# Patient Record
Sex: Female | Born: 1975 | Race: Black or African American | Hispanic: Yes | Marital: Single | Smoking: Never smoker
Health system: Southern US, Community
[De-identification: ages and names within clinical notes are randomized; demographics above are authoritative.]

## PROBLEM LIST (undated history)

## (undated) DIAGNOSIS — C801 Malignant (primary) neoplasm, unspecified: Secondary | ICD-10-CM

## (undated) DIAGNOSIS — K5900 Constipation, unspecified: Secondary | ICD-10-CM

## (undated) HISTORY — PX: ABDOMINOPLASTY: SUR9

## (undated) HISTORY — PX: DILATION AND CURETTAGE OF UTERUS: SHX78

---

## 2006-06-19 ENCOUNTER — Inpatient Hospital Stay (HOSPITAL_COMMUNITY): Admission: AD | Admit: 2006-06-19 | Discharge: 2006-06-21 | Payer: Self-pay | Admitting: Obstetrics and Gynecology

## 2006-08-15 ENCOUNTER — Other Ambulatory Visit: Admission: RE | Admit: 2006-08-15 | Discharge: 2006-08-15 | Payer: Self-pay | Admitting: Obstetrics and Gynecology

## 2006-08-29 ENCOUNTER — Encounter: Admission: RE | Admit: 2006-08-29 | Discharge: 2006-08-29 | Payer: Self-pay | Admitting: Occupational Medicine

## 2007-11-12 ENCOUNTER — Emergency Department (HOSPITAL_COMMUNITY): Admission: EM | Admit: 2007-11-12 | Discharge: 2007-11-12 | Payer: Self-pay | Admitting: Emergency Medicine

## 2010-12-16 ENCOUNTER — Encounter: Payer: Self-pay | Admitting: Obstetrics and Gynecology

## 2011-04-12 NOTE — H&P (Signed)
NAMECHRISTEAN, Sandra Macias NO.:  [id number]   MEDICAL RECORD NO.:  [id number]          PATIENT TYPE:  INP   LOCATION:                                [facility]   PHYSICIAN:  Huel Cote, M.D.      DATE OF BIRTH:   DATE OF ADMISSION:  06/19/2006  DATE OF DISCHARGE:  06/21/2006                                HISTORY & PHYSICAL   Re-schedule repeat C-section to be performed on 06/19/2006 at 7:30 a.m.   Patient is a 35 year old G5, P-1-0-3-1 who is coming in a approximately [redacted]  weeks gestation with an estimated due date of 06/28/2006 by her by her LMP  consistent with a first trimester ultrasound.  Patient has had a previous C-  section in the past and requested a repeat C-section initially; however, had  also been found to have a persistent breach presentation, and for both of  these reasons is undergoing a repeat C-section.  Her prenatal care otherwise  was uncomplicated, except for a positive group B strep status.  Prenatal  labs are as follows:  A-positive, antibody negative, sickle normal, RPR  nonreactive, Rubella immune, hepatitis B surface antigen negative, HIV  nonreactive, GC negative, Chlamydia negative.  Triple screen normal.  Group  B strep positive.   PAST OBSTETRICAL HISTORY:  Patient had a previous C-section in [place] in  the year 2000 of a 7-pound 8-ounce infant for a meconium and a non-  reassuring fetal heart re-tracing.  She has also had two spontaneous  miscarriages and one elective abortion.   PAST GYN HISTORY:  In 2001, the patient had an abnormal pap smear with an  office procedure performed, and this did normalize her pap smears.   PAST SURGICAL HISTORY:  The c-section only.   PAST MEDICAL HISTORY:  None significant.  Patient reports no known drug  allergies, is currently just taking her prenatal vitamins.   PHYSICAL EXAMINATION:  VITAL SIGNS:  Her weight is 199 pounds, blood  pressure 108/70.  CARDIAC:  Regular rate and  rhythm.  LUNGS:  Clear.  ABDOMEN:  Soft and nontender and gravid.  CERVIX:  Long and fingertip with a breach presentation noted just several  days prior to her admission for repeat c-section.  The patient was counseled  as to the risks and benefits of vaginal birth after a c-section prior to  finding of a breach presentation and had already decided that she would  proceed with a repeat c-section if no labor ensued by her 39th week.  Furthermore, she was found to have the breach presentation which persisted  through several weeks at the end of her pregnancy, and for this reason also  is a candidate for cesarean section.  She understands the risks and  benefits of c-section including bleeding and infection and possible damage  to bowel and bladder.  She understands that if she had a need for blood  transfusion this would be performed, although certainly not routine or  expected, and is okay with that as well.      Huel Cote, M.D.  Electronically Signed  KR/MEDQ  D:  06/18/2006  T:  06/18/2006  Job:  [id number]

## 2011-04-12 NOTE — Discharge Summary (Signed)
Sandra Macias, Sandra Macias          ACCOUNT NO.:  [id number]   MEDICAL RECORD NO.:  [id number]          PATIENT TYPE:  INP   LOCATION:  9124                          [facility]ILITY:  [facility]   PHYSICIAN:  Zenaida Niece, M.D.DATE OF BIRTH:  13-Sep-1976   DATE OF ADMISSION:  06/19/2006  DATE OF DISCHARGE:  06/21/2006                                 DISCHARGE SUMMARY   ADMISSION AND DISCHARGE DIAGNOSES:  Intrauterine pregnancy at 39 weeks,  previous cesarean section.   PROCEDURES:  She had a repeat low transverse cesarean section.   HISTORY AND PHYSICAL:  Please see chart for full History and Physical.  Briefly this is a 35 year old black female gravida 5, para 1-0-3-1 with an  EGA of [redacted] weeks who presents for repeat cesarean section. This is being done  for prior cesarean section and breech presentation. Prenatal care  essentially uncomplicated. Past history significant for 1 prior cesarean  section, 2 miscarriages, and 1 elective termination. The remainder of  history is noncontributory.   PHYSICAL EXAMINATION:  Significant for benign abdomen that is gravid with a  well-healed transverse scar. Cervix is fingertip and long, and there is a  breech presentation noted several days prior to admission.   HOSPITAL COURSE:  The patient was admitted and underwent repeat low  transverse cesarean section under spinal anesthesia. The baby was in the  vertex presentation at delivery. Estimated blood loss was 800 mL, and she  delivered a viable female infant with Apgars of 9 and 9, weight 7 pounds 2  ounces. The patient's anatomy was normal. Postoperatively she had no  significant complications. Predelivery hemoglobin 11.4; postoperative it is  10.5. On postoperative day #2 the patient requested discharge home, and she  was felt to be stable enough for this. Her incision was healing well.   DISCHARGE INSTRUCTIONS:  Regular diet, pelvic rest, no strenuous activity.  Follow-up is in 3-4 days for staple  removal.  Medications are Percocet #30 1-  2 p.o. q. 4-6 h. p.r.n. pain and over-the-counter ibuprofen as needed, and  she is given our discharge pamphlet.      Zenaida Niece, M.D.  Electronically Signed     TDM/MEDQ  D:  06/21/2006  T:  06/21/2006  Job:  [id number]

## 2011-04-12 NOTE — Op Note (Signed)
Sandra Macias, Sandra Macias NO.:  [id number]   MEDICAL RECORD NO.:  [id number]          PATIENT TYPE:  INP   LOCATION:  9199                          [facility]ILITY[facility]   PHYSICIAN:  Huel Cote, M.D. DATE OF BIRTH:  Dec 17, 1975   DATE OF PROCEDURE:  06/19/2006  DATE OF DISCHARGE:                                 OPERATIVE REPORT   PREOPERATIVE DIAGNOSES:  1.  Term pregnancy at 39 weeks.  2.  Repeat cesarean section, declines vaginal birth after cesarean section.   POSTOPERATIVE DIAGNOSES:  1.  Term pregnancy at 39 weeks.  2.  Repeat cesarean section, declines vaginal birth after cesarean section.   PROCEDURE:  Repeat low transverse cesarean section   SURGEON:  Huel Cote, M.D.   ASSISTANT:  Zenaida Niece, M.D.   ANESTHESIA:  Spinal.   SPECIMEN:  Placenta was sent to Labor and Delivery.   ESTIMATED BLOOD LOSS:  800 mL.   URINE OUTPUT:  200-mL clear urine.   INTRAVENOUS FLUIDS:  1500 mL LR.   FINDINGS:  There is a vigorous female infant in the vertex presentation.  She had spontaneously reverted from her previous breech position.  Apgar  scores were 9/9.  Weight was 7 pounds 2 ounces.  There were normal tubes and  ovaries noted, as well as a normal uterus.   PROCEDURE:  The patient was taken to the operating room, where spinal  anesthesia was obtained without difficulty.  She was then prepped and draped  in the normal sterile fashion in the dorsal supine position with a leftward  tilt.  A Pfannenstiel skin incision was then made through a preexisting scar  and carried through to the underlying layer of fascia by sharp dissection  and Bovie cautery.  Fascia was then nicked in midline and the incision was  extended laterally with the Mayo scissors.  The inferior aspect of the  incision was grasped with Kocher clamps, elevated and dissected off the  underlying rectus muscles.  In a similar fashion, the superior aspect was  dissected off the rectus  muscles.  The rectus muscles were then noted to be  opened in the midline and the peritoneal cavity exposed.  The peritoneal  cavity incision was then extended both superiorly and inferiorly, with  careful attention to avoid both bowel and bladder.  The bladder blade was  then inserted and the lower uterine segment exposed.  The lower uterine  segment was then incised in a transverse fashion and the cavity itself  entered bluntly; this was then extended bluntly.  The infant's head was then  elevated to the level of the incision and fundal pressure placed; however,  the head was not forthcoming.  The Kiwi vacuum was then applied to the  appropriate position on the fetal vertex and the vertex easily delivered  with gentle traction and the remainder of the infant was delivered and bulb-  suctioned and the cord clamped and cut, with the infant handed to the  awaiting pediatricians.  Cord blood was obtained and the placenta was then  expressed manually and handed off for cord blood donation.  The uterus  was  cleared of all clots and debris with moist lap sponge.  Ring forceps just  applied at each angle and the uterine incision was then closed in 2 layers,  the first a running locked layer of 0 chromic and the second an imbricating  layer of same.  Good hemostasis was noted.  The tubes and ovaries were  inspected and found to be normal.  The pelvis was irrigated and no active  bleeding was noted.  At this point, the subfascial planes were inspected and  found be hemostatic.  There was no bleeding noted on the muscle; therefore,  the fascia was closed with 0 Vicryl in a running fashion and skin was closed  with staples.  Sponge, lap and needle counts were correct x2 and the patient  was taken to the recovery room in stable condition      Huel Cote, M.D.  Electronically Signed     KR/MEDQ  D:  06/19/2006  T:  06/19/2006  Job:  [id number]

## 2015-06-29 ENCOUNTER — Encounter (HOSPITAL_COMMUNITY): Payer: Self-pay | Admitting: *Deleted

## 2015-07-06 ENCOUNTER — Other Ambulatory Visit: Payer: Self-pay | Admitting: Obstetrics and Gynecology

## 2015-07-20 MED ORDER — DOXYCYCLINE HYCLATE 100 MG IV SOLR
100.0000 mg | INTRAVENOUS | Status: AC
  Administered 2015-07-21: 100 mg via INTRAVENOUS
  Filled 2015-07-20 (×2): qty 100

## 2015-07-20 NOTE — Anesthesia Preprocedure Evaluation (Addendum)
Anesthesia Evaluation  Patient identified by MRN, date of birth, ID band Patient awake    Reviewed: Allergy & Precautions, NPO status , Patient's Chart, lab work & pertinent test results  History of Anesthesia Complications Negative for: history of anesthetic complications  Airway Mallampati: II  TM Distance: >3 FB Neck ROM: Full    Dental no notable dental hx. (+) Dental Advisory Given   Pulmonary neg pulmonary ROS,  breath sounds clear to auscultation  Pulmonary exam normal       Cardiovascular negative cardio ROS Normal cardiovascular examRhythm:Regular Rate:Normal     Neuro/Psych negative neurological ROS  negative psych ROS   GI/Hepatic negative GI ROS, Neg liver ROS,   Endo/Other  negative endocrine ROS  Renal/GU negative Renal ROS  negative genitourinary   Musculoskeletal negative musculoskeletal ROS (+)   Abdominal   Peds negative pediatric ROS (+)  Hematology negative hematology ROS (+)   Anesthesia Other Findings   Reproductive/Obstetrics negative OB ROS                            Anesthesia Physical Anesthesia Plan  ASA: II  Anesthesia Plan: General   Post-op Pain Management:    Induction: Intravenous  Airway Management Planned: LMA  Additional Equipment:   Intra-op Plan:   Post-operative Plan: Extubation in OR  Informed Consent: I have reviewed the patients History and Physical, chart, labs and discussed the procedure including the risks, benefits and alternatives for the proposed anesthesia with the patient or authorized representative who has indicated his/her understanding and acceptance.   Dental advisory given  Plan Discussed with: CRNA  Anesthesia Plan Comments:         Anesthesia Quick Evaluation

## 2015-07-21 ENCOUNTER — Ambulatory Visit (HOSPITAL_COMMUNITY): Payer: 59 | Admitting: Anesthesiology

## 2015-07-21 ENCOUNTER — Encounter (HOSPITAL_COMMUNITY)
Admission: RE | Disposition: A | Discharge status: Home / Self Care | Payer: Self-pay | Source: Ambulatory Visit | Attending: Obstetrics and Gynecology

## 2015-07-21 ENCOUNTER — Ambulatory Visit (HOSPITAL_COMMUNITY)
Admission: RE | Admit: 2015-07-21 | Discharge: 2015-07-21 | Disposition: A | Discharge status: Home / Self Care | Payer: 59 | Source: Ambulatory Visit | Attending: Obstetrics and Gynecology | Admitting: Obstetrics and Gynecology

## 2015-07-21 ENCOUNTER — Encounter (HOSPITAL_COMMUNITY): Payer: Self-pay | Admitting: *Deleted

## 2015-07-21 DIAGNOSIS — Z91013 Allergy to seafood: Secondary | ICD-10-CM | POA: Insufficient documentation

## 2015-07-21 DIAGNOSIS — N9489 Other specified conditions associated with female genital organs and menstrual cycle: Secondary | ICD-10-CM

## 2015-07-21 DIAGNOSIS — N920 Excessive and frequent menstruation with regular cycle: Secondary | ICD-10-CM | POA: Diagnosis not present

## 2015-07-21 DIAGNOSIS — N840 Polyp of corpus uteri: Secondary | ICD-10-CM | POA: Insufficient documentation

## 2015-07-21 HISTORY — PX: DILATATION & CURRETTAGE/HYSTEROSCOPY WITH RESECTOCOPE: SHX5572

## 2015-07-21 LAB — CBC
HCT: 33.2 % — ABNORMAL LOW (ref 36.0–46.0)
HEMOGLOBIN: 11 g/dL — AB (ref 12.0–15.0)
MCH: 29.3 pg (ref 26.0–34.0)
MCHC: 33.1 g/dL (ref 30.0–36.0)
MCV: 88.3 fL (ref 78.0–100.0)
PLATELETS: 214 10*3/uL (ref 150–400)
RBC: 3.76 MIL/uL — AB (ref 3.87–5.11)
RDW: 14 % (ref 11.5–15.5)
WBC: 8.1 10*3/uL (ref 4.0–10.5)

## 2015-07-21 LAB — PREGNANCY, URINE: PREG TEST UR: NEGATIVE

## 2015-07-21 SURGERY — DILATATION & CURETTAGE/HYSTEROSCOPY WITH RESECTOCOPE
Anesthesia: General | Site: Vagina

## 2015-07-21 MED ORDER — FENTANYL CITRATE (PF) 100 MCG/2ML IJ SOLN
INTRAMUSCULAR | Status: AC
  Filled 2015-07-21: qty 4

## 2015-07-21 MED ORDER — DEXAMETHASONE SODIUM PHOSPHATE 4 MG/ML IJ SOLN
INTRAMUSCULAR | Status: AC
  Filled 2015-07-21: qty 1

## 2015-07-21 MED ORDER — SCOPOLAMINE 1 MG/3DAYS TD PT72
1.0000 | MEDICATED_PATCH | Freq: Once | TRANSDERMAL | Status: DC
  Administered 2015-07-21: 1.5 mg via TRANSDERMAL

## 2015-07-21 MED ORDER — MIDAZOLAM HCL 2 MG/2ML IJ SOLN
INTRAMUSCULAR | Status: DC | PRN
  Administered 2015-07-21: 2 mg via INTRAVENOUS

## 2015-07-21 MED ORDER — OXYCODONE-ACETAMINOPHEN 5-325 MG PO TABS
1.0000 | ORAL_TABLET | ORAL | Status: DC | PRN
  Administered 2015-07-21: 1 via ORAL

## 2015-07-21 MED ORDER — ONDANSETRON HCL 4 MG/2ML IJ SOLN: 4.0000 mg | Freq: Once | INTRAMUSCULAR | Status: DC | PRN

## 2015-07-21 MED ORDER — LIDOCAINE HCL (CARDIAC) 20 MG/ML IV SOLN
INTRAVENOUS | Status: DC | PRN
  Administered 2015-07-21: 100 mg via INTRAVENOUS

## 2015-07-21 MED ORDER — FENTANYL CITRATE (PF) 100 MCG/2ML IJ SOLN
INTRAMUSCULAR | Status: DC | PRN
  Administered 2015-07-21: 25 ug via INTRAVENOUS
  Administered 2015-07-21 (×2): 50 ug via INTRAVENOUS
  Administered 2015-07-21: 25 ug via INTRAVENOUS

## 2015-07-21 MED ORDER — KETOROLAC TROMETHAMINE 30 MG/ML IJ SOLN
INTRAMUSCULAR | Status: AC
  Filled 2015-07-21: qty 1

## 2015-07-21 MED ORDER — LACTATED RINGERS IV SOLN
INTRAVENOUS | Status: DC
  Administered 2015-07-21: 08:00:00 via INTRAVENOUS

## 2015-07-21 MED ORDER — FENTANYL CITRATE (PF) 100 MCG/2ML IJ SOLN
INTRAMUSCULAR | Status: AC
  Administered 2015-07-21: 25 ug via INTRAVENOUS
  Filled 2015-07-21: qty 2

## 2015-07-21 MED ORDER — CHLOROPROCAINE HCL 1 % IJ SOLN
INTRAMUSCULAR | Status: AC
  Filled 2015-07-21: qty 30

## 2015-07-21 MED ORDER — KETOROLAC TROMETHAMINE 30 MG/ML IJ SOLN
INTRAMUSCULAR | Status: DC | PRN
  Administered 2015-07-21: 30 mg via INTRAVENOUS
  Administered 2015-07-21: 30 mg via INTRAMUSCULAR

## 2015-07-21 MED ORDER — ONDANSETRON HCL 4 MG/2ML IJ SOLN
INTRAMUSCULAR | Status: AC
  Filled 2015-07-21: qty 2

## 2015-07-21 MED ORDER — SCOPOLAMINE 1 MG/3DAYS TD PT72
MEDICATED_PATCH | TRANSDERMAL | Status: AC
  Filled 2015-07-21: qty 1

## 2015-07-21 MED ORDER — MIDAZOLAM HCL 2 MG/2ML IJ SOLN
INTRAMUSCULAR | Status: AC
  Filled 2015-07-21: qty 4

## 2015-07-21 MED ORDER — DEXAMETHASONE SODIUM PHOSPHATE 10 MG/ML IJ SOLN
INTRAMUSCULAR | Status: DC | PRN
  Administered 2015-07-21: 4 mg via INTRAVENOUS

## 2015-07-21 MED ORDER — OXYCODONE-ACETAMINOPHEN 5-325 MG PO TABS
ORAL_TABLET | ORAL | Status: AC
  Filled 2015-07-21: qty 1

## 2015-07-21 MED ORDER — ONDANSETRON HCL 4 MG/2ML IJ SOLN
INTRAMUSCULAR | Status: DC | PRN
  Administered 2015-07-21: 4 mg via INTRAVENOUS

## 2015-07-21 MED ORDER — PROPOFOL 10 MG/ML IV BOLUS
INTRAVENOUS | Status: DC | PRN
  Administered 2015-07-21: 170 mg via INTRAVENOUS

## 2015-07-21 MED ORDER — FENTANYL CITRATE (PF) 100 MCG/2ML IJ SOLN
25.0000 ug | INTRAMUSCULAR | Status: DC | PRN
  Administered 2015-07-21 (×2): 25 ug via INTRAVENOUS

## 2015-07-21 MED ORDER — PROPOFOL 10 MG/ML IV BOLUS
INTRAVENOUS | Status: AC
  Filled 2015-07-21: qty 20

## 2015-07-21 MED ORDER — LIDOCAINE HCL (CARDIAC) 20 MG/ML IV SOLN
INTRAVENOUS | Status: AC
  Filled 2015-07-21: qty 5

## 2015-07-21 SURGICAL SUPPLY — 18 items
CANISTER SUCT 3000ML (MISCELLANEOUS) ×2 IMPLANT
CATH ROBINSON RED A/P 16FR (CATHETERS) ×2 IMPLANT
CLOTH BEACON ORANGE TIMEOUT ST (SAFETY) ×2 IMPLANT
CONTAINER PREFILL 10% NBF 60ML (FORM) ×4 IMPLANT
ELECT REM PT RETURN 9FT ADLT (ELECTROSURGICAL) ×2
ELECTRODE REM PT RTRN 9FT ADLT (ELECTROSURGICAL) ×1 IMPLANT
GLOVE BIOGEL PI IND STRL 7.0 (GLOVE) ×2 IMPLANT
GLOVE BIOGEL PI INDICATOR 7.0 (GLOVE) ×2
GLOVE ECLIPSE 6.5 STRL STRAW (GLOVE) ×2 IMPLANT
GLYCINE 1.5% IRRIG UROMATIC (IV SOLUTION) ×4 IMPLANT
GOWN STRL REUS W/TWL LRG LVL3 (GOWN DISPOSABLE) ×4 IMPLANT
LOOP ANGLED CUTTING 22FR (CUTTING LOOP) ×2 IMPLANT
PACK VAGINAL MINOR WOMEN LF (CUSTOM PROCEDURE TRAY) ×2 IMPLANT
PAD OB MATERNITY 4.3X12.25 (PERSONAL CARE ITEMS) ×2 IMPLANT
TOWEL OR 17X24 6PK STRL BLUE (TOWEL DISPOSABLE) ×4 IMPLANT
TUBING AQUILEX INFLOW (TUBING) ×2 IMPLANT
TUBING AQUILEX OUTFLOW (TUBING) ×2 IMPLANT
WATER STERILE IRR 1000ML POUR (IV SOLUTION) ×2 IMPLANT

## 2015-07-21 NOTE — Anesthesia Procedure Notes (Signed)
Procedure Name: LMA Insertion Date/Time: 07/21/2015 8:26 AM Performed by: Hewitt Blade Pre-anesthesia Checklist: Patient identified, Emergency Drugs available, Suction available and Patient being monitored Patient Re-evaluated:Patient Re-evaluated prior to inductionOxygen Delivery Method: Circle system utilized Preoxygenation: Pre-oxygenation with 100% oxygen Intubation Type: IV induction LMA: LMA inserted LMA Size: 4.0 Placement Confirmation: positive ETCO2 and breath sounds checked- equal and bilateral Tube secured with: Tape Dental Injury: Teeth and Oropharynx as per pre-operative assessment

## 2015-07-21 NOTE — Anesthesia Postprocedure Evaluation (Signed)
  Anesthesia Post-op Note  Patient: Sandra Macias  Procedure(s) Performed: Procedure(s) (LRB): DILATATION & CURETTAGE/HYSTEROSCOPY WITH RESECTOCOPE (N/A)  Patient Location: PACU  Anesthesia Type: General  Level of Consciousness: awake and alert   Airway and Oxygen Therapy: Patient Spontanous Breathing  Post-op Pain: mild  Post-op Assessment: Post-op Vital signs reviewed, Patient's Cardiovascular Status Stable, Respiratory Function Stable, Patent Airway and No signs of Nausea or vomiting  Last Vitals:  Filed Vitals:   07/21/15 1000  BP: 116/74  Pulse: 73  Temp:   Resp: 12    Post-op Vital Signs: stable   Complications: No apparent anesthesia complications

## 2015-07-21 NOTE — Discharge Instructions (Signed)
DISCHARGE INSTRUCTIONS: HYSTEROSCOPY / ENDOMETRIAL ABLATION The following instructions have been prepared to help you care for yourself upon your return home.  May Remove Scop patch on or before  07/23/2015  May take Ibuprofen after  2:40 pm as needed for pain   Personal hygiene:  Use sanitary pads for vaginal drainage, not tampons.  Shower the day after your procedure.  NO tub baths, pools or Jacuzzis for 2-3 weeks.  Wipe front to back after using the bathroom.  Activity and limitations:  Do NOT drive or operate any equipment for 24 hours. The effects of anesthesia are still present and drowsiness may result.  Do NOT rest in bed all day.  Walking is encouraged.  Walk up and down stairs slowly.  You may resume your normal activity in one to two days or as indicated by your physician.  Sexual activity: NO intercourse for at least 2 weeks after the procedure, or as indicated by your Doctor.  Diet: Eat a light meal as desired this evening. You may resume your usual diet tomorrow.  Return to Work: You may resume your work activities in one to two days or as indicated by Marine scientist.  What to expect after your surgery: Expect to have vaginal bleeding/discharge for 2-3 days and spotting for up to 10 days. It is not unusual to have soreness for up to 1-2 weeks. You may have a slight burning sensation when you urinate for the first day. Mild cramps may continue for a couple of days. You may have a regular period in 2-6 weeks.  Call your doctor for any of the following:  Excessive vaginal bleeding or clotting, saturating and changing one pad every hour.  Inability to urinate 6 hours after discharge from hospital.  Pain not relieved by pain medication.  Fever of 100.4 F or greater.  Unusual vaginal discharge or odor.

## 2015-07-21 NOTE — Transfer of Care (Signed)
Immediate Anesthesia Transfer of Care Note  Patient: Sandra Macias  Procedure(s) Performed: Procedure(s): DILATATION & CURETTAGE/HYSTEROSCOPY WITH RESECTOCOPE (N/A)  Patient Location: PACU  Anesthesia Type:General  Level of Consciousness: awake, alert  and oriented  Airway & Oxygen Therapy: Patient Spontanous Breathing and Patient connected to nasal cannula oxygen  Post-op Assessment: Report given to RN, Post -op Vital signs reviewed and stable and Patient moving all extremities  Post vital signs: Reviewed and stable  Last Vitals:  Filed Vitals:   07/21/15 0727  BP: 118/78  Pulse: 84  Temp: 36.6 C  Resp: 18    Complications: No apparent anesthesia complications

## 2015-07-21 NOTE — Brief Op Note (Signed)
07/21/2015  9:30 AM  PATIENT:  Sandra Macias  39 y.o. female  PRE-OPERATIVE DIAGNOSIS:  Endometrial Polyps, Breakthrough Bleeding on Oral Contraceptive Pills  POST-OPERATIVE DIAGNOSIS:  Endometrial Polyps, Breakthrough Bleeding on Oral Contraceptive Pills  PROCEDURE:  Diagnostic hysteroscopy, hysteroscopic resection of endometrial polyps, dilation and curettage  SURGEON:  Surgeon(s) and Role:    * Servando Salina, MD - Primary  PHYSICIAN ASSISTANT:   ASSISTANTS: none   ANESTHESIA:   general  EBL:  Total I/O In: 800 [I.V.:800] Out: 35 [Urine:30; Blood:5]  BLOOD ADMINISTERED:none  DRAINS: none   LOCAL MEDICATIONS USED:  NONE  SPECIMEN:  Source of Specimen:  emc, polyp  DISPOSITION OF SPECIMEN:  PATHOLOGY  COUNTS:  YES  TOURNIQUET:  * No tourniquets in log *  DICTATION: .Other Dictation: Dictation Number [id number]  PLAN OF CARE: Discharge to home after PACU  PATIENT DISPOSITION:  PACU - hemodynamically stable.   Delay start of Pharmacological VTE agent (>24hrs) due to surgical blood loss or risk of bleeding: no

## 2015-07-21 NOTE — H&P (Signed)
Sandra Macias is an 39 y.o. female. G5 P2032 BF presents for surgical management of endometrial polyp noted on sonohysterogram. Pt has had menorrhagia with regular cycles  Pertinent Gynecological History: Menses: heavy Bleeding: menorrhagia Contraception: tubal ligation DES exposure: denies Blood transfusions: none Sexually transmitted diseases: no past history Previous GYN Procedures: C/S, cryo , Last mammogram: normal Date: 2014 Last pap: normal Date: 2016 OB History: [id number]   Menstrual History: Menarche age: n/a Patient's last menstrual period was 07/20/2015 (approximate).    History reviewed. No pertinent past medical history.  Past Surgical History  Procedure Laterality Date  . Cesarean section      2000 2007  . Dilation and curettage of uterus    . Abdominoplasty      2010    History reviewed. No pertinent family history.  Social History:  reports that she has never smoked. She has never used smokeless tobacco. She reports that she does not drink alcohol or use illicit drugs.  Allergies:  Allergies  Allergen Reactions  . Shellfish Allergy Anaphylaxis    Prescriptions prior to admission  Medication Sig Dispense Refill Last Dose  . ibuprofen (ADVIL,MOTRIN) 800 MG tablet Take 800 mg by mouth every 8 (eight) hours as needed for headache.     . Linaclotide (LINZESS PO) Take 1 tablet by mouth daily as needed (chronic constipation).       Review of Systems  All other systems reviewed and are negative.   Last menstrual period 07/20/2015. Physical Exam  Constitutional: She is oriented to person, place, and time. She appears well-developed and well-nourished.  Eyes: EOM are normal.  Neck: Normal range of motion.  Respiratory: Breath sounds normal.  GI: Soft.  Neurological: She is alert and oriented to person, place, and time.  Skin: Skin is warm and dry.  Psychiatric: She has a normal mood and affect.  vulva nl Vagina nl Cervix nl Adnexa no palp  mass Uterus AV  No results found for this or any previous visit (from the past 24 hour(s)).  No results found.  Assessment/Plan: Menorrhagia with reg cycles Endometrial polyps P) dx hysteroscopy, hysteroscopic resection of endometrial polyp, D&C. Risk of surgery reviewed including infection, bleeding, uterine perforation and its risk, thermal injury, fluid overload. All? answered  Arian Mcquitty A 07/21/2015, 7:22 AM

## 2015-07-22 NOTE — Op Note (Signed)
Sandra Macias, FAGERSTROM NO.:  [id number]  MEDICAL RECORD NO.:  [id number]  LOCATION:  [facility]                          FACILITY:  [place] [facility]  PHYSICIAN:  Servando Salina, M.D.DATE OF BIRTH:  16-May-1976  DATE OF PROCEDURE:  07/21/2015 DATE OF DISCHARGE:  07/21/2015                              OPERATIVE REPORT   PREOPERATIVE DIAGNOSES: 1. Menorrhagia. 2. Endometrial mass.  PROCEDURE:  Diagnostic hysteroscopy, hysteroscopic resection of endometrial polyps, and Dilation and Curettage.  POSTOPERATIVE DIAGNOSES: 1. Endometrial polyps. 2. Menorrhagia.  ANESTHESIA:  General.  SURGEON:  Servando Salina, M.D.  ASSISTANT:  None.  DESCRIPTION OF PROCEDURE:  Under adequate general anesthesia, the patient was placed in dorsal lithotomy position.  She was sterilely prepped and draped in usual fashion.  The bladder was catheterized with a moderate amount of urine.  Examination under anesthesia revealed anteverted uterus.  No adnexal masses could be appreciated.  A bivalve speculum was placed in vagina.  A single-tooth tenaculum was placed on the anterior lip of the cervix.  The cervix was serially dilated up to #31 [facility] dilator.  A glycine primed resectoscope with a single loop was introduced into the uterine cavity.  Both tubal ostia's were seen.  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  Several polypoid lesions were noted.  The largest of the lower uterine segment posteriorly.  All other polypoid lesions were resected.  The cavity was then curetted. The resectoscope was then reinserted.  When all tissue was felt to have been removed.  All instruments were then removed from the vagina.  SPECIMEN:  Labeled endometrial polyps with curetting were sent to Pathology.  ESTIMATED BLOOD LOSS:  Minimal.  COMPLICATION:  None.  The patient tolerated the procedure well was transferred to recovery in stable condition.     Servando Salina, M.D.     Robert Lee/MEDQ   D:  07/21/2015  T:  07/22/2015  Job:  [id number]

## 2015-07-24 ENCOUNTER — Encounter (HOSPITAL_COMMUNITY): Payer: Self-pay | Admitting: Obstetrics and Gynecology

## 2018-12-18 ENCOUNTER — Emergency Department (HOSPITAL_BASED_OUTPATIENT_CLINIC_OR_DEPARTMENT_OTHER): Payer: 59

## 2018-12-18 ENCOUNTER — Emergency Department (HOSPITAL_BASED_OUTPATIENT_CLINIC_OR_DEPARTMENT_OTHER)
Admission: EM | Admit: 2018-12-18 | Discharge: 2018-12-18 | Disposition: A | Discharge status: Home / Self Care | Payer: 59 | Attending: Emergency Medicine | Admitting: Emergency Medicine

## 2018-12-18 ENCOUNTER — Encounter (HOSPITAL_BASED_OUTPATIENT_CLINIC_OR_DEPARTMENT_OTHER): Payer: Self-pay | Admitting: *Deleted

## 2018-12-18 ENCOUNTER — Other Ambulatory Visit: Payer: Self-pay

## 2018-12-18 DIAGNOSIS — R591 Generalized enlarged lymph nodes: Secondary | ICD-10-CM | POA: Insufficient documentation

## 2018-12-18 DIAGNOSIS — R221 Localized swelling, mass and lump, neck: Secondary | ICD-10-CM | POA: Diagnosis present

## 2018-12-18 MED ORDER — CLINDAMYCIN HCL 150 MG PO CAPS: 450.0000 mg | ORAL_CAPSULE | Freq: Three times a day (TID) | ORAL | 0 refills | Status: AC

## 2018-12-18 MED ORDER — IOPAMIDOL (ISOVUE-300) INJECTION 61%
80.0000 mL | Freq: Once | INTRAVENOUS | Status: AC | PRN
  Administered 2018-12-18: 75 mL via INTRAVENOUS

## 2018-12-18 NOTE — ED Notes (Signed)
Pt given drink with verbal approval from Dr. Tyrone Nine

## 2018-12-18 NOTE — Discharge Instructions (Signed)
Return for fever, difficulty breathing or swallowing

## 2018-12-18 NOTE — ED Notes (Signed)
Patient transported to CT 

## 2018-12-18 NOTE — ED Provider Notes (Signed)
[facility] DEPARTMENT Provider Note   CSN: [id number] Arrival date & time: 12/18/18  1055     History   Chief Complaint Chief Complaint  Patient presents with  . Lymphadenopathy    HPI Sandra Macias is a 43 y.o. female.  43 yo F with a chief complaint of left-sided neck swelling.  Is been ongoing for this patient.  She has been seen as an outpatient and had an ultrasound followed by a CT scan with contrast and about 15 days ago had a lymph node biopsy.  Does not seem to be cancerous based on the recent work-up.  She had been on antibiotics with some improvement and then had some worsening and her oral surgeon started her on a Medrol Dosepak.  Since then the swelling is actually gotten a little bit worse.  She denies any difficulty breathing or swallowing.  Denies fevers or chills.  Feels the area has gotten slightly more tender.  She denies oral involvement denies cough or congestion denies ear pain.  The history is provided by the patient.  Illness  Severity:  Mild Onset quality:  Gradual Duration:  2 days Timing:  Constant Progression:  Worsening Chronicity:  New Associated symptoms: no chest pain, no congestion, no fever, no headaches, no myalgias, no nausea, no rhinorrhea, no shortness of breath, no vomiting and no wheezing     History reviewed. No pertinent past medical history.  There are no active problems to display for this patient.   Past Surgical History:  Procedure Laterality Date  . ABDOMINOPLASTY     2010  . CESAREAN SECTION     2000 2007  . DILATATION & CURRETTAGE/HYSTEROSCOPY WITH RESECTOCOPE N/A 07/21/2015   Procedure: DILATATION & CURETTAGE/HYSTEROSCOPY WITH RESECTOCOPE;  Surgeon: Servando Salina, MD;  Location: [facility];  Service: Gynecology;  Laterality: N/A;  . DILATION AND CURETTAGE OF UTERUS       OB History   No obstetric history on file.      Home Medications    Prior to Admission medications   Medication Sig  Start Date End Date Taking? Authorizing Provider  ibuprofen (ADVIL,MOTRIN) 800 MG tablet Take 800 mg by mouth every 8 (eight) hours as needed for headache.   Yes [provider]  Linaclotide (LINZESS PO) Take 1 tablet by mouth daily as needed (chronic constipation).   Yes [provider]  clindamycin (CLEOCIN) 150 MG capsule Take 3 capsules (450 mg total) by mouth 3 (three) times daily for 10 days. 12/18/18 12/28/18  Deno Etienne, DO    Family History No family history on file.  Social History Social History   Tobacco Use  . Smoking status: Never Smoker  . Smokeless tobacco: Never Used  Substance Use Topics  . Alcohol use: Not Currently  . Drug use: No     Allergies   Shellfish allergy   Review of Systems Review of Systems  Constitutional: Negative for chills and fever.  HENT: Negative for congestion and rhinorrhea.        Left neck swelling   Eyes: Negative for redness and visual disturbance.  Respiratory: Negative for shortness of breath and wheezing.   Cardiovascular: Negative for chest pain and palpitations.  Gastrointestinal: Negative for nausea and vomiting.  Genitourinary: Negative for dysuria and urgency.  Musculoskeletal: Negative for arthralgias and myalgias.  Skin: Negative for pallor and wound.  Neurological: Negative for dizziness and headaches.     Physical Exam Updated Vital Signs BP 108/82 (BP Location: Left Arm)  Pulse 76   Temp 98.8 F (37.1 C) (Oral)   Resp 18   Ht 5\' 7"  (1.702 m)   Wt 91.6 kg   LMP 12/04/2018   SpO2 100%   BMI 31.64 kg/m   Physical Exam Vitals signs and nursing note reviewed.  Constitutional:      General: She is not in acute distress.    Appearance: She is well-developed. She is not diaphoretic.  HENT:     Head: Normocephalic and atraumatic.     Comments: Left anterior cervical lymphadenopathy.  No overlying erythema.  No noted intraoral swelling, no sublingual swelling tolerating secretions without  difficulty.  Left TM without effusion erythema or bulging.  No noted rhinorrhea. Eyes:     Pupils: Pupils are equal, round, and reactive to light.  Neck:     Musculoskeletal: Normal range of motion and neck supple.  Cardiovascular:     Rate and Rhythm: Normal rate and regular rhythm.     Heart sounds: No murmur. No friction rub. No gallop.   Pulmonary:     Effort: Pulmonary effort is normal.     Breath sounds: No wheezing or rales.  Abdominal:     General: There is no distension.     Palpations: Abdomen is soft.     Tenderness: There is no abdominal tenderness.  Musculoskeletal:        General: No tenderness.  Skin:    General: Skin is warm and dry.  Neurological:     Mental Status: She is alert and oriented to person, place, and time.  Psychiatric:        Behavior: Behavior normal.      ED Treatments / Results  Labs (all labs ordered are listed, but only abnormal results are displayed) Labs Reviewed - No data to display  EKG None  Radiology Ct Soft Tissue Neck W Contrast  Result Date: 12/18/2018 CLINICAL DATA:  Left neck swelling.  Multiple neck mass. EXAM: CT NECK WITH CONTRAST TECHNIQUE: Multidetector CT imaging of the neck was performed using the standard protocol following the bolus administration of intravenous contrast. CONTRAST:  32mL ISOVUE-300 IOPAMIDOL (ISOVUE-300) INJECTION 61% COMPARISON:  None. FINDINGS: Pharynx and larynx: Normal. No mass or swelling. Salivary glands: No inflammation, mass, or stone. Thyroid: Mild thyroid enlargement with tiny nodules bilaterally. Lymph nodes: Multiple enlarged lymph nodes in the left neck. 16 mm left submandibular node, 12 mm left submandibular node. Left level 2 node 14 mm and 15 mm. 17 mm lymph node posterior to the left jugular vein. Many of these nodes have ill-defined margins with stranding in the surrounding fat. Additional subcentimeter posterior lymph nodes on the left. 8 mm and 10 mm supraclavicular level 5 nodes on the  left. Right level 2 lymph node 10 mm.  No pathologic nodes on the right. Vascular: Normal vascular enhancement Limited intracranial: Negative Visualized orbits: Negative Mastoids and visualized paranasal sinuses: Negative Skeleton: Negative Upper chest: Negative Other: None IMPRESSION: Enlarged lymph nodes in the left neck with ill-defined margins suggesting tumor. No primary tumor in the neck. Recommend direct mucosal inspection and tissue sampling. Electronically Signed   By: Franchot Gallo M.D.   On: 12/18/2018 13:11    Procedures Procedures (including critical care time)  Medications Ordered in ED Medications  iopamidol (ISOVUE-300) 61 % injection 80 mL (75 mLs Intravenous Contrast Given 12/18/18 1244)     Initial Impression / Assessment and Plan / ED Course  I have reviewed the triage vital signs and the nursing notes.  Pertinent labs & imaging results that were available during my care of the patient were reviewed by me and considered in my medical decision making (see chart for details).     43 yo F with a chief complaint of left neck swelling.  This is been going on for the past couple months.  She is had an extensive outpatient work-up and recently had a biopsy done that showed that this was not lymphoma.  She is here because it is gotten larger and she feels that it is extending further down her neck over the past day or so.  This is despite her being on oral steroids.  My exam the patient is well-appearing and nontoxic.  Is not significantly tender on my exam there is no surrounding erythema.  I will discuss with ENT.   I discussed the case with Dr. Redmond Baseman.  He recommended starting on clindamycin and having her follow-up in the office next week.  He felt that her symptoms were getting worse then the next that might be an excisional biopsy.  2:09 PM:  I have discussed the diagnosis/risks/treatment options with the patient and believe the pt to be eligible for discharge home to  follow-up with ENT. We also discussed returning to the ED immediately if new or worsening sx occur. We discussed the sx which are most concerning (e.g., sudden worsening pain, fever, inability to tolerate by mouth, sob) that necessitate immediate return. Medications administered to the patient during their visit and any new prescriptions provided to the patient are listed below.  Medications given during this visit Medications  iopamidol (ISOVUE-300) 61 % injection 80 mL (75 mLs Intravenous Contrast Given 12/18/18 1244)     The patient appears reasonably screen and/or stabilized for discharge and I doubt any other medical condition or other [facility] requiring further screening, evaluation, or treatment in the ED at this time prior to discharge.   Final Clinical Impressions(s) / ED Diagnoses   Final diagnoses:  Lymphadenopathy    ED Discharge Orders         Ordered    clindamycin (CLEOCIN) 150 MG capsule  3 times daily     12/18/18 Lima, Yannet Rincon, DO 12/18/18 1409

## 2018-12-18 NOTE — ED Triage Notes (Signed)
Her neck has been swollen since December. She has had CT's and biopsies. She woke this am and the swelling was much worse. She does not have SOB of difficulty swallowing.

## 2019-01-28 HISTORY — PX: OTHER SURGICAL HISTORY: SHX169

## 2019-06-23 ENCOUNTER — Other Ambulatory Visit: Payer: Self-pay | Admitting: Radiology

## 2019-10-26 ENCOUNTER — Other Ambulatory Visit: Payer: Self-pay | Admitting: Obstetrics and Gynecology

## 2019-11-08 ENCOUNTER — Other Ambulatory Visit: Payer: Self-pay

## 2019-11-08 ENCOUNTER — Other Ambulatory Visit (HOSPITAL_COMMUNITY)
Admission: RE | Admit: 2019-11-08 | Discharge: 2019-11-08 | Disposition: A | Discharge status: Home / Self Care | Payer: 59 | Source: Ambulatory Visit | Attending: Obstetrics and Gynecology | Admitting: Obstetrics and Gynecology

## 2019-11-08 ENCOUNTER — Encounter (HOSPITAL_BASED_OUTPATIENT_CLINIC_OR_DEPARTMENT_OTHER): Payer: Self-pay | Admitting: Obstetrics and Gynecology

## 2019-11-08 DIAGNOSIS — Z20828 Contact with and (suspected) exposure to other viral communicable diseases: Secondary | ICD-10-CM | POA: Diagnosis not present

## 2019-11-08 DIAGNOSIS — Z01812 Encounter for preprocedural laboratory examination: Secondary | ICD-10-CM | POA: Insufficient documentation

## 2019-11-08 NOTE — Progress Notes (Addendum)
Spoke w/ via phone for pre-op interview---[facility] needs dos----     Urine preg, cbc         Lab results------ COVID test ------11-08-2019 Arrive at -------530 am 11-11-2019 NPO after ------midnight Medications to take morning of surgery ----- Diabetic medication ----- Patient Special Instructions -----patient stated has bowel prep instructions from dr, clear liquids day before surgery with 1/2 bottle magnesium citrate in am, 1/2 bottle in pm and enema at hs Pre-Op special Istructions ----- Patient verbalized understanding of instructions that were given at this phone interview. Patient denies shortness of breath, chest pain, fever, cough a this phone interview.

## 2019-11-09 LAB — NOVEL CORONAVIRUS, NAA (HOSP ORDER, SEND-OUT TO REF LAB; TAT 18-24 HRS): SARS-CoV-2, NAA: NOT DETECTED

## 2019-11-10 ENCOUNTER — Encounter (HOSPITAL_BASED_OUTPATIENT_CLINIC_OR_DEPARTMENT_OTHER): Payer: Self-pay | Admitting: Obstetrics and Gynecology

## 2019-11-10 NOTE — Anesthesia Preprocedure Evaluation (Addendum)
Anesthesia Evaluation  Patient identified by MRN, date of birth, ID band Patient awake    Reviewed: Allergy & Precautions, NPO status , Patient's Chart, lab work & pertinent test results, reviewed documented beta blocker date and time   Airway Mallampati: II  TM Distance: >3 FB Neck ROM: Full    Dental no notable dental hx. 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   Anesthesia Other Findings   Reproductive/Obstetrics HSV                           Anesthesia Physical Anesthesia Plan  ASA: II  Anesthesia Plan: General   Post-op Pain Management:    Induction: Intravenous  PONV Risk Score and Plan: 4 or greater and Scopolamine patch - Pre-op, Midazolam, Ondansetron, Dexamethasone and Treatment may vary due to age or medical condition  Airway Management Planned: Oral ETT  Additional Equipment:   Intra-op Plan:   Post-operative Plan: Extubation in OR  Informed Consent: I have reviewed the patients History and Physical, chart, labs and discussed the procedure including the risks, benefits and alternatives for the proposed anesthesia with the patient or authorized representative who has indicated his/her understanding and acceptance.     Dental advisory given  Plan Discussed with: CRNA and Surgeon  Anesthesia Plan Comments:        Anesthesia Quick Evaluation

## 2019-11-11 ENCOUNTER — Encounter (HOSPITAL_BASED_OUTPATIENT_CLINIC_OR_DEPARTMENT_OTHER): Payer: Self-pay | Admitting: Obstetrics and Gynecology

## 2019-11-11 ENCOUNTER — Ambulatory Visit (HOSPITAL_BASED_OUTPATIENT_CLINIC_OR_DEPARTMENT_OTHER): Payer: 59 | Admitting: Anesthesiology

## 2019-11-11 ENCOUNTER — Encounter (HOSPITAL_BASED_OUTPATIENT_CLINIC_OR_DEPARTMENT_OTHER)
Admission: RE | Disposition: A | Discharge status: Home / Self Care | Payer: Self-pay | Source: Home / Self Care | Attending: Obstetrics and Gynecology

## 2019-11-11 ENCOUNTER — Ambulatory Visit (HOSPITAL_BASED_OUTPATIENT_CLINIC_OR_DEPARTMENT_OTHER)
Admission: RE | Admit: 2019-11-11 | Discharge: 2019-11-11 | Disposition: A | Discharge status: Home / Self Care | Payer: 59 | Attending: Obstetrics and Gynecology | Admitting: Obstetrics and Gynecology

## 2019-11-11 DIAGNOSIS — Z6831 Body mass index (BMI) 31.0-31.9, adult: Secondary | ICD-10-CM | POA: Diagnosis not present

## 2019-11-11 DIAGNOSIS — N736 Female pelvic peritoneal adhesions (postinfective): Secondary | ICD-10-CM | POA: Insufficient documentation

## 2019-11-11 DIAGNOSIS — Z79899 Other long term (current) drug therapy: Secondary | ICD-10-CM | POA: Insufficient documentation

## 2019-11-11 DIAGNOSIS — Z803 Family history of malignant neoplasm of breast: Secondary | ICD-10-CM | POA: Insufficient documentation

## 2019-11-11 DIAGNOSIS — Z1501 Genetic susceptibility to malignant neoplasm of breast: Secondary | ICD-10-CM | POA: Insufficient documentation

## 2019-11-11 DIAGNOSIS — E669 Obesity, unspecified: Secondary | ICD-10-CM | POA: Diagnosis not present

## 2019-11-11 DIAGNOSIS — N838 Other noninflammatory disorders of ovary, fallopian tube and broad ligament: Secondary | ICD-10-CM | POA: Insufficient documentation

## 2019-11-11 HISTORY — PX: ROBOTIC ASSISTED SALPINGO OOPHERECTOMY: SHX6082

## 2019-11-11 HISTORY — DX: Malignant (primary) neoplasm, unspecified: C80.1

## 2019-11-11 HISTORY — DX: Constipation, unspecified: K59.00

## 2019-11-11 LAB — CBC
HCT: 35.4 % — ABNORMAL LOW (ref 36.0–46.0)
Hemoglobin: 11.8 g/dL — ABNORMAL LOW (ref 12.0–15.0)
MCH: 29.9 pg (ref 26.0–34.0)
MCHC: 33.3 g/dL (ref 30.0–36.0)
MCV: 89.8 fL (ref 80.0–100.0)
Platelets: 188 10*3/uL (ref 150–400)
RBC: 3.94 MIL/uL (ref 3.87–5.11)
RDW: 14.1 % (ref 11.5–15.5)
WBC: 4.2 10*3/uL (ref 4.0–10.5)
nRBC: 0 % (ref 0.0–0.2)

## 2019-11-11 LAB — POCT PREGNANCY, URINE: Preg Test, Ur: NEGATIVE

## 2019-11-11 SURGERY — SALPINGO-OOPHORECTOMY, ROBOT-ASSISTED
Anesthesia: General | Laterality: Bilateral

## 2019-11-11 MED ORDER — METOCLOPRAMIDE HCL 5 MG/ML IJ SOLN
10.0000 mg | Freq: Once | INTRAMUSCULAR | Status: DC | PRN
  Filled 2019-11-11: qty 2

## 2019-11-11 MED ORDER — BUPIVACAINE HCL (PF) 0.25 % IJ SOLN
INTRAMUSCULAR | Status: AC
  Filled 2019-11-11: qty 30

## 2019-11-11 MED ORDER — FENTANYL CITRATE (PF) 100 MCG/2ML IJ SOLN
INTRAMUSCULAR | Status: DC | PRN
  Administered 2019-11-11 (×3): 50 ug via INTRAVENOUS
  Administered 2019-11-11: 100 ug via INTRAVENOUS

## 2019-11-11 MED ORDER — OXYCODONE HCL 5 MG PO TABS
ORAL_TABLET | ORAL | Status: AC
  Filled 2019-11-11: qty 1

## 2019-11-11 MED ORDER — LIDOCAINE 2% (20 MG/ML) 5 ML SYRINGE
INTRAMUSCULAR | Status: DC | PRN
  Administered 2019-11-11: 80 mg via INTRAVENOUS

## 2019-11-11 MED ORDER — KETAMINE HCL 10 MG/ML IJ SOLN
INTRAMUSCULAR | Status: DC | PRN
  Administered 2019-11-11: 30 mg via INTRAVENOUS

## 2019-11-11 MED ORDER — EPHEDRINE 5 MG/ML INJ
INTRAVENOUS | Status: AC
  Filled 2019-11-11: qty 10

## 2019-11-11 MED ORDER — KETOROLAC TROMETHAMINE 30 MG/ML IJ SOLN
INTRAMUSCULAR | Status: AC
  Filled 2019-11-11: qty 1

## 2019-11-11 MED ORDER — FENTANYL CITRATE (PF) 250 MCG/5ML IJ SOLN
INTRAMUSCULAR | Status: AC
  Filled 2019-11-11: qty 5

## 2019-11-11 MED ORDER — FENTANYL CITRATE (PF) 100 MCG/2ML IJ SOLN
25.0000 ug | INTRAMUSCULAR | Status: DC | PRN
  Filled 2019-11-11: qty 1

## 2019-11-11 MED ORDER — LIDOCAINE 2% (20 MG/ML) 5 ML SYRINGE
INTRAMUSCULAR | Status: AC
  Filled 2019-11-11: qty 5

## 2019-11-11 MED ORDER — DEXAMETHASONE SODIUM PHOSPHATE 10 MG/ML IJ SOLN
INTRAMUSCULAR | Status: DC | PRN
  Administered 2019-11-11: 10 mg via INTRAVENOUS

## 2019-11-11 MED ORDER — SUGAMMADEX SODIUM 200 MG/2ML IV SOLN
INTRAVENOUS | Status: DC | PRN
  Administered 2019-11-11: 200 mg via INTRAVENOUS

## 2019-11-11 MED ORDER — ACETAMINOPHEN 325 MG PO TABS
ORAL_TABLET | ORAL | Status: DC | PRN
  Administered 2019-11-11: 1000 mg via ORAL

## 2019-11-11 MED ORDER — MIDAZOLAM HCL 2 MG/2ML IJ SOLN
INTRAMUSCULAR | Status: DC | PRN
  Administered 2019-11-11: 2 mg via INTRAVENOUS

## 2019-11-11 MED ORDER — LACTATED RINGERS IV SOLN
INTRAVENOUS | Status: DC
  Filled 2019-11-11: qty 1000

## 2019-11-11 MED ORDER — ROCURONIUM BROMIDE 10 MG/ML (PF) SYRINGE
PREFILLED_SYRINGE | INTRAVENOUS | Status: DC | PRN
  Administered 2019-11-11: 60 mg via INTRAVENOUS
  Administered 2019-11-11: 15 mg via INTRAVENOUS

## 2019-11-11 MED ORDER — MEPERIDINE HCL 25 MG/ML IJ SOLN
6.2500 mg | INTRAMUSCULAR | Status: DC | PRN
  Filled 2019-11-11: qty 1

## 2019-11-11 MED ORDER — MIDAZOLAM HCL 2 MG/2ML IJ SOLN
INTRAMUSCULAR | Status: AC
  Filled 2019-11-11: qty 2

## 2019-11-11 MED ORDER — ROCURONIUM BROMIDE 10 MG/ML (PF) SYRINGE
PREFILLED_SYRINGE | INTRAVENOUS | Status: AC
  Filled 2019-11-11: qty 10

## 2019-11-11 MED ORDER — SCOPOLAMINE 1 MG/3DAYS TD PT72
MEDICATED_PATCH | TRANSDERMAL | Status: AC
  Filled 2019-11-11: qty 1

## 2019-11-11 MED ORDER — ARTIFICIAL TEARS OPHTHALMIC OINT
TOPICAL_OINTMENT | OPHTHALMIC | Status: AC
  Filled 2019-11-11: qty 3.5

## 2019-11-11 MED ORDER — ONDANSETRON HCL 4 MG/2ML IJ SOLN
INTRAMUSCULAR | Status: DC | PRN
  Administered 2019-11-11: 4 mg via INTRAVENOUS

## 2019-11-11 MED ORDER — KETOROLAC TROMETHAMINE 30 MG/ML IJ SOLN
INTRAMUSCULAR | Status: DC | PRN
  Administered 2019-11-11: 30 mg via INTRAVENOUS

## 2019-11-11 MED ORDER — ACETAMINOPHEN 500 MG PO TABS
ORAL_TABLET | ORAL | Status: AC
  Filled 2019-11-11: qty 2

## 2019-11-11 MED ORDER — DEXAMETHASONE SODIUM PHOSPHATE 10 MG/ML IJ SOLN
INTRAMUSCULAR | Status: AC
  Filled 2019-11-11: qty 1

## 2019-11-11 MED ORDER — OXYCODONE-ACETAMINOPHEN 5-325 MG PO TABS: 1.0000 | ORAL_TABLET | Freq: Four times a day (QID) | ORAL | 0 refills | Status: AC | PRN

## 2019-11-11 MED ORDER — KETAMINE HCL 10 MG/ML IJ SOLN
INTRAMUSCULAR | Status: AC
  Filled 2019-11-11: qty 1

## 2019-11-11 MED ORDER — SODIUM CHLORIDE 0.9 % IR SOLN
Status: DC | PRN
  Administered 2019-11-11: 3000 mL

## 2019-11-11 MED ORDER — IBUPROFEN 800 MG PO TABS: 800.0000 mg | ORAL_TABLET | Freq: Three times a day (TID) | ORAL | 4 refills | Status: DC | PRN

## 2019-11-11 MED ORDER — SODIUM CHLORIDE (PF) 0.9 % IJ SOLN
INTRAMUSCULAR | Status: AC
  Filled 2019-11-11: qty 50

## 2019-11-11 MED ORDER — OXYCODONE HCL 5 MG PO TABS
5.0000 mg | ORAL_TABLET | Freq: Once | ORAL | Status: AC
  Administered 2019-11-11: 5 mg via ORAL
  Filled 2019-11-11: qty 1

## 2019-11-11 MED ORDER — BUPIVACAINE HCL (PF) 0.25 % IJ SOLN
INTRAMUSCULAR | Status: DC | PRN
  Administered 2019-11-11: 10 mL

## 2019-11-11 MED ORDER — SCOPOLAMINE 1 MG/3DAYS TD PT72
1.0000 | MEDICATED_PATCH | TRANSDERMAL | Status: DC
  Administered 2019-11-11: 1 via TRANSDERMAL
  Filled 2019-11-11: qty 1

## 2019-11-11 MED ORDER — PROPOFOL 10 MG/ML IV BOLUS
INTRAVENOUS | Status: DC | PRN
  Administered 2019-11-11: 200 mg via INTRAVENOUS

## 2019-11-11 MED ORDER — 0.9 % SODIUM CHLORIDE (POUR BTL) OPTIME
TOPICAL | Status: DC | PRN
  Administered 2019-11-11: 10:00:00 1000 mL

## 2019-11-11 MED ORDER — EPHEDRINE SULFATE 50 MG/ML IJ SOLN
INTRAMUSCULAR | Status: DC | PRN
  Administered 2019-11-11 (×2): 10 mg via INTRAVENOUS

## 2019-11-11 MED ORDER — ONDANSETRON HCL 4 MG/2ML IJ SOLN
INTRAMUSCULAR | Status: AC
  Filled 2019-11-11: qty 2

## 2019-11-11 SURGICAL SUPPLY — 61 items
APPLICATOR ARISTA FLEXITIP XL (MISCELLANEOUS) IMPLANT
BARRIER ADHS 3X4 INTERCEED (GAUZE/BANDAGES/DRESSINGS) IMPLANT
CANISTER SUCT 3000ML PPV (MISCELLANEOUS) ×2 IMPLANT
CATH FOLEY 3WAY  5CC 16FR (CATHETERS) ×1
CATH FOLEY 3WAY 5CC 16FR (CATHETERS) ×1 IMPLANT
CHLORAPREP W/TINT 26 (MISCELLANEOUS) ×1 IMPLANT
COVER BACK TABLE 60X90IN (DRAPES) ×2 IMPLANT
COVER TIP SHEARS 8 DVNC (MISCELLANEOUS) ×1 IMPLANT
COVER TIP SHEARS 8MM DA VINCI (MISCELLANEOUS) ×1
DECANTER SPIKE VIAL GLASS SM (MISCELLANEOUS) ×2 IMPLANT
DEFOGGER SCOPE WARMER CLEARIFY (MISCELLANEOUS) ×2 IMPLANT
DERMABOND ADVANCED (GAUZE/BANDAGES/DRESSINGS) ×1
DERMABOND ADVANCED .7 DNX12 (GAUZE/BANDAGES/DRESSINGS) ×1 IMPLANT
DRAPE ARM DVNC X/XI (DISPOSABLE) ×4 IMPLANT
DRAPE COLUMN DVNC XI (DISPOSABLE) ×1 IMPLANT
DRAPE DA VINCI XI ARM (DISPOSABLE) ×4
DRAPE DA VINCI XI COLUMN (DISPOSABLE) ×1
ELECT REM PT RETURN 9FT ADLT (ELECTROSURGICAL) ×2
ELECTRODE REM PT RTRN 9FT ADLT (ELECTROSURGICAL) ×1 IMPLANT
GAUZE 4X4 16PLY RFD (DISPOSABLE) ×2 IMPLANT
GLOVE BIOGEL PI IND STRL 7.0 (GLOVE) ×5 IMPLANT
GLOVE BIOGEL PI INDICATOR 7.0 (GLOVE) ×5
GLOVE ECLIPSE 6.5 STRL STRAW (GLOVE) ×6 IMPLANT
HEMOSTAT ARISTA ABSORB 3G PWDR (HEMOSTASIS) IMPLANT
IRRIG SUCT STRYKERFLOW 2 WTIP (MISCELLANEOUS) ×2
IRRIGATION SUCT STRKRFLW 2 WTP (MISCELLANEOUS) ×1 IMPLANT
LEGGING LITHOTOMY PAIR STRL (DRAPES) ×2 IMPLANT
NEEDLE INSUFFLATION 120MM (ENDOMECHANICALS) ×2 IMPLANT
OBTURATOR OPTICAL STANDARD 8MM (TROCAR) ×1
OBTURATOR OPTICAL STND 8 DVNC (TROCAR) ×1
OBTURATOR OPTICALSTD 8 DVNC (TROCAR) ×1 IMPLANT
PACK ROBOT WH (CUSTOM PROCEDURE TRAY) ×2 IMPLANT
PACK ROBOTIC GOWN (GOWN DISPOSABLE) ×2 IMPLANT
PACK TRENDGUARD 450 HYBRID PRO (MISCELLANEOUS) ×1 IMPLANT
PAD PREP 24X48 CUFFED NSTRL (MISCELLANEOUS) ×2 IMPLANT
PROTECTOR NERVE ULNAR (MISCELLANEOUS) ×4 IMPLANT
SEAL CANN UNIV 5-8 DVNC XI (MISCELLANEOUS) ×4 IMPLANT
SEAL XI 5MM-8MM UNIVERSAL (MISCELLANEOUS) ×4
SEALER VESSEL DA VINCI XI (MISCELLANEOUS)
SEALER VESSEL EXT DVNC XI (MISCELLANEOUS) ×1 IMPLANT
SET IRRIG Y TYPE TUR BLADDER L (SET/KITS/TRAYS/PACK) IMPLANT
SET TRI-LUMEN FLTR TB AIRSEAL (TUBING) ×2 IMPLANT
SUT VIC AB 0 CT1 36 (SUTURE) ×4 IMPLANT
SUT VIC AB 3-0 SH 27 (SUTURE) ×1
SUT VIC AB 3-0 SH 27X BRD (SUTURE) IMPLANT
SUT VIC AB 4-0 PS2 18 (SUTURE) ×3 IMPLANT
SUT VICRYL 0 UR6 27IN ABS (SUTURE) ×1 IMPLANT
SUT VICRYL 4-0 PS2 18IN ABS (SUTURE) ×4 IMPLANT
SUT VLOC 180 0 9IN  GS21 (SUTURE)
SUT VLOC 180 0 9IN GS21 (SUTURE) ×1 IMPLANT
SYS BAG RETRIEVAL 10MM (BASKET) ×4
SYS RETRIEVAL 5MM INZII UNIV (BASKET) ×2
SYSTEM BAG RETRIEVAL 10MM (BASKET) IMPLANT
SYSTEM RETRIEVL 5MM INZII UNIV (BASKET) IMPLANT
TIP UTERINE 6.7X10CM GRN DISP (MISCELLANEOUS) ×1 IMPLANT
TIP UTERINE 6.7X8CM BLUE DISP (MISCELLANEOUS) ×1 IMPLANT
TOWEL OR 17X26 10 PK STRL BLUE (TOWEL DISPOSABLE) ×4 IMPLANT
TRENDGUARD 450 HYBRID PRO PACK (MISCELLANEOUS) ×2
TROCAR PORT AIRSEAL 5X120 (TROCAR) ×1 IMPLANT
TROCAR PORT AIRSEAL 8X120 (TROCAR) ×2 IMPLANT
WATER STERILE IRR 1000ML POUR (IV SOLUTION) ×2 IMPLANT

## 2019-11-11 NOTE — Discharge Instructions (Signed)
Post Anesthesia Home Care Instructions  Activity: Get plenty of rest for the remainder of the day. A responsible adult should stay with you for 24 hours following the procedure.  For the next 24 hours, DO NOT: -Drive a car -Operate machinery -Drink alcoholic beverages -Take any medication unless instructed by your physician -Make any legal decisions or sign important papers.  Meals: Start with liquid foods such as gelatin or soup. Progress to regular foods as tolerated. Avoid greasy, spicy, heavy foods. If nausea and/or vomiting occur, drink only clear liquids until the nausea and/or vomiting subsides. Call your physician if vomiting continues.  Special Instructions/Symptoms: Your throat may feel dry or sore from the anesthesia or the breathing tube placed in your throat during surgery. If this causes discomfort, gargle with warm salt water. The discomfort should disappear within 24 hours.  If you had a scopolamine patch placed behind your ear for the management of post- operative nausea and/or vomiting:  1. The medication in the patch is effective for 72 hours, after which it should be removed.  Wrap patch in a tissue and discard in the trash. Wash hands thoroughly with soap and water. 2. You may remove the patch earlier than 72 hours if you experience unpleasant side effects which may include dry mouth, dizziness or visual disturbances. 3. Avoid touching the patch. Wash your hands with soap and water after contact with the patch.   DISCHARGE INSTRUCTIONS: Laparoscopy  The following instructions have been prepared to help you care for yourself upon your return home today.  Wound care: . Do not get the incision wet for the first 24 hours. The incision should be kept clean and dry. . The Band-Aids or dressings may be removed the day after surgery. . Should the incision become sore, red, and swollen after the first week, check with your doctor.  Personal hygiene: . Shower the day  after your procedure.  Activity and limitations: . Do NOT drive or operate any equipment today. . Do NOT lift anything more than 15 pounds for 2-3 weeks after surgery. . Do NOT rest in bed all day. . Walking is encouraged. Walk each day, starting slowly with 5-minute walks 3 or 4 times a day. Slowly increase the length of your walks. . Walk up and down stairs slowly. . Do NOT do strenuous activities, such as golfing, playing tennis, bowling, running, biking, weight lifting, gardening, mowing, or vacuuming for 2-4 weeks. Ask your doctor when it is okay to start.  Diet: Eat a light meal as desired this evening. You may resume your usual diet tomorrow.  Return to work: This is dependent on the type of work you do. For the most part you can return to a desk job within a week of surgery. If you are more active at work, please discuss this with your doctor.  What to expect after your surgery: You may have a slight burning sensation when you urinate on the first day. You may have a very small amount of blood in the urine. Expect to have a small amount of vaginal discharge/light bleeding for 1-2 weeks. It is not unusual to have abdominal soreness and bruising for up to 2 weeks. You may be tired and need more rest for about 1 week. You may experience shoulder pain for 24-72 hours. Lying flat in bed may relieve it.  Call your doctor for any of the following: . Develop a fever of 100.4 or greater . Inability to urinate 6 hours after discharge   from hospital  Severe pain not relieved by pain medications  Persistent of heavy bleeding at incision site  Redness or swelling around incision site after a week  Increasing nausea or vomiting  Patient Signature________________________________________ Nurse Signature_________________________________________CALL  IF TEMP>100.4, NOTHING PER VAGINA X 2 WK, CALL IF SOAKING A MAXI  PAD EVERY HOUR OR MORE FREQUENTLY

## 2019-11-11 NOTE — Brief Op Note (Signed)
11/11/2019  11:21 AM  PATIENT:  Sandra Macias  43 y.o. female  PRE-OPERATIVE DIAGNOSIS:  BRCA 2 positive  POST-OPERATIVE DIAGNOSIS:  BRCA 2 positive, pelvic adhesions  PROCEDURE:  Da Vinci robotic BSO, pelvic washings  SURGEON:  Surgeon(s) and Role:    * Cousins, Sheronette, MD - Primary    * Almquist, Susan E, MD - Assisting  PHYSICIAN ASSISTANT:   ASSISTANTS: Susan Almquist, MD   ANESTHESIA:   general FINDINGS;  left tube with paratubal cyst attached to bowel epipolica, left ovary nl, right tube and ovary nl, anterior wall omental adhesions, nl liver edge, nl appendix, nl ureters, nl uterus. Nl anterior and posterior cul de sac EBL:  40 mL   BLOOD ADMINISTERED:none  DRAINS: none   LOCAL MEDICATIONS USED:  MARCAINE     SPECIMEN:  Source of Specimen:  left tube and ovary, right tube and ovary, pelvic washings  DISPOSITION OF SPECIMEN:  PATHOLOGY  COUNTS:  YES  TOURNIQUET:  * No tourniquets in log *  DICTATION: .Other Dictation: Dictation Number [id number]  PLAN OF CARE: Discharge to home after PACU  PATIENT DISPOSITION:  PACU - hemodynamically stable.   Delay start of Pharmacological VTE agent (>24hrs) due to surgical blood loss or risk of bleeding: no  

## 2019-11-11 NOTE — Transfer of Care (Signed)
Immediate Anesthesia Transfer of Care Note  Patient: Sandra Macias  Procedure(s) Performed: XI ROBOTIC ASSISTED SALPINGO OOPHORECTOMY/With Pelvic Washings (Bilateral )  Patient Location: PACU  Anesthesia Type:General  Level of Consciousness: awake, alert , oriented and patient cooperative  Airway & Oxygen Therapy: Patient Spontanous Breathing and Patient connected to nasal cannula oxygen  Post-op Assessment: Report given to RN and Post -op Vital signs reviewed and stable  Post vital signs: Reviewed and stable  Last Vitals:  Vitals Value Taken Time  BP    Temp    Pulse    Resp    SpO2      Last Pain:  Vitals:   11/11/19 0628  TempSrc: Oral  PainSc: 0-No pain      Patients Stated Pain Goal: 3 ([id number])  Complications: No apparent anesthesia complications

## 2019-11-11 NOTE — Anesthesia Procedure Notes (Signed)
Procedure Name: Intubation Date/Time: 11/11/2019 7:31 AM Performed by: Wanita Chamberlain, CRNA Pre-anesthesia Checklist: Patient identified, Emergency Drugs available, Suction available, Patient being monitored and Timeout performed Patient Re-evaluated:Patient Re-evaluated prior to induction Oxygen Delivery Method: Circle system utilized Preoxygenation: Pre-oxygenation with 100% oxygen Induction Type: IV induction Ventilation: Mask ventilation without difficulty Laryngoscope Size: Mac and 3 Grade View: Grade I Tube type: Oral Tube size: 7.0 mm Number of attempts: 1 Airway Equipment and Method: Stylet and Bite block Placement Confirmation: breath sounds checked- equal and bilateral,  CO2 detector,  positive ETCO2 and ETT inserted through vocal cords under direct vision Secured at: 22 cm Tube secured with: Tape Dental Injury: Teeth and Oropharynx as per pre-operative assessment

## 2019-11-11 NOTE — Anesthesia Postprocedure Evaluation (Signed)
Anesthesia Post Note  Patient: Sandra Macias  Procedure(s) Performed: XI ROBOTIC ASSISTED SALPINGO OOPHORECTOMY/With Pelvic Washings (Bilateral )     Patient location during evaluation: PACU Anesthesia Type: General Level of consciousness: awake and alert and oriented Pain management: pain level controlled Vital Signs Assessment: post-procedure vital signs reviewed and stable Respiratory status: nonlabored ventilation, respiratory function stable and spontaneous breathing Cardiovascular status: blood pressure returned to baseline and stable Postop Assessment: no apparent nausea or vomiting Anesthetic complications: no    Last Vitals:  Vitals:   11/11/19 1145 11/11/19 1200  BP: 94/78 110/68  Pulse: 84 81  Resp: 14 10  Temp:    SpO2: 100% 100%    Last Pain:  Vitals:   11/11/19 1200  TempSrc:   PainSc: 0-No pain                 Barnie Sopko A.

## 2019-11-12 LAB — CYTOLOGY - NON PAP

## 2019-11-12 LAB — SURGICAL PATHOLOGY

## 2019-11-12 NOTE — Op Note (Signed)
NAMEBLAKELEY, SCHEIER MEDICAL RECORD [id number] ACCOUNT [id number] DATE OF BIRTH:1975-12-13 FACILITY: [facility] LOCATION: [facility] PHYSICIAN:Eryx Zane A. Taylan Mayhan, MD  OPERATIVE REPORT  DATE OF PROCEDURE:  11/11/2019  PREOPERATIVE DIAGNOSES:  BRCA2 positive.  Family history of breast cancer.  PROCEDURE:  Da Vinci robotic bilateral salpingo-oophorectomy, pelvic washings.  POSTOPERATIVE DIAGNOSES:  BRCA2 positive, pelvic adhesions.  ANESTHESIA:  General.  SURGEON:  Servando Salina, MD  ASSISTANT:  Tiana Loft, MD   DESCRIPTION OF PROCEDURE:  Under adequate general anesthesia, the patient was placed in the dorsal lithotomy position.  She was examined under anesthesia, anteverted uterus.  No adnexal masses could be appreciated.  The patient was sterilely prepped and  draped in usual fashion.  Indwelling Foley catheter was sterilely placed.  Bivalve speculum was placed in the vagina.  Single tooth tenaculum was placed on the anterior lip of the cervix.  The uterus sounded to 10 cm; however, initial attempt at fitting  the uterine manipulator with a 10 mm cannula tip was unsuccessful.  Therefore, an 8 mm cannula tip was placed and the retractor removed.  Attention was then turned to the abdomen.  The abdomen was notable for a prior history of a tummy tuck.   Supraumbilical to the umbilicus, a vertical incision was made after 0.25% Marcaine was injected.  Incision was then made.  A Veress needle was introduced, tested with normal saline.  CO2 was insufflated.  Veress needle was then removed.  An 8 mm robotic  port was placed.  A robotic video laparoscope was placed, showing no evidence of any trauma on entry into the abdomen.  On initial presentation, panoramic inspection, normal liver edge, omental adhesions to anterior abdominal wall.  Two  robotic ports were placed laterally and a 5 mm AirSeal port was then placed under direct visualization.  Once this was done, the robot was  docked.  A liter of fluid was placed in the abdomen.  Pelvic washings performed and sent.  The robot was docked.  In arm 3 was the monopolar scissors, arm #1 was the long bipolar forceps.  I  then went to the surgical console.  At the surgical console, it was noted that the left tube had a distal paratubal cyst and adhesions that extended to the bowel overlying on the left side.  The left ureter was deep in the pelvis.  The right ureter was peristalsing well.  The retroperitoneal space was opened bilaterally.  Dissection was then performed bilaterally, avoiding the ureter  inferiorly.  Proximally, cauterization bilaterally was done in serial sequential fashion and then the IP ligaments were then severed at the pelvic brims. The underlying peritoneum was serially clamp, cauterized and cut and carried all the way close to the cornual region where the fallopian tube was cauterized and cut and the remaining uteroovarian ligaments were serially clamped, cauterized, and then cut with detachment of the left tube and ovary.  Same procedure was performed on the contralateral side, carrying the IP ligament isolation all the way to the pelvic brim and the right tube and ovary was also removed .  Once that was done, both specimen were placed on the anterior cul-de-sac.  The omental adhesions anteriorly were serially clamped, cauterized and cut and the appendix was noted to be normal.  Normal liver edge was noted.  I went back to the bedside  sterilely.  The robot was undocked.  The decision was made to extend a supraumbilical incision in order to facilitate removal of the specimen.  The specimens  were both placed in a 5 mm Endobag and brought out through the supraumbilical site.  The fascia was identified at  supraumbilical site and 0 Vicryl figure-of-eight suture was placed.  At the same time, the surgical tech reported that there was only the left tube and ovary with the paratubal cyst in the bag and therefore that fascial  incision was re-opened.  Several ports were then placed and the abdomen reinsufflated.  After a period of time exploration of the pelvis, gutters and upper abdomen subsequently revealed the right ovary and tube had fallen into the posterior cul-de-sac. The tube and ovary  was then removed and put in a 5 mm bag and brought out through supraumbilical site.  Panoramic inspection was done again and showed good hemostasis at all pedicles and dissected areas.  The incisions were then closed with 0 Vicryl figure-of-eight suture at the supraumbilical site and the skin approximated using 4-0 Vicryl subcuticular closure. A sponge on a stick was used to manipulate the uterus on second collection of the specimen due to prior removal of the tenaculum and uterine manipulator.  Manipulation of the uterus resulted in a small tear at the introitus, which was repaired with 3-0 Vicryl suture.  SPECIMEN:  Left tube and ovary with paratubal cyst, right tube and ovary, pelvic washings. EBL: 40 ml Intraoperative fluid: 1100 ml Urine output: 885 ml COMPLICATION: none DISPOSITION:  The patient tolerated the procedure well and was transferred to recovery in stable condition.  CN/NUANCE  D:11/11/2019 T:11/12/2019 JOB:[id number]

## 2020-01-17 ENCOUNTER — Ambulatory Visit: Payer: 59 | Attending: Family

## 2020-01-17 DIAGNOSIS — Z23 Encounter for immunization: Secondary | ICD-10-CM | POA: Insufficient documentation

## 2020-01-17 NOTE — Progress Notes (Signed)
   Covid-19 Vaccination Clinic  Name:  Sandra Macias    MRN: [id number] DOB: 09/29/1976  01/17/2020  Sandra Macias was observed post Covid-19 immunization for 15 minutes without incidence. She was provided with Vaccine Information Sheet and instruction to access the V-Safe system.   Sandra Macias was instructed to call 911 with any severe reactions post vaccine: Marland Kitchen Difficulty breathing  . Swelling of your face and throat  . A fast heartbeat  . A bad rash all over your body  . Dizziness and weakness    Immunizations Administered    Name Date Dose VIS Date Route   Moderna COVID-19 Vaccine 01/17/2020 10:01 AM 0.5 mL 10/26/2019 Intramuscular   Manufacturer: Moderna   Lot: [id number]   [place]:[id number]

## 2020-02-15 ENCOUNTER — Ambulatory Visit: Payer: 59 | Attending: Family

## 2020-02-15 DIAGNOSIS — Z23 Encounter for immunization: Secondary | ICD-10-CM

## 2020-02-15 NOTE — Progress Notes (Signed)
   Covid-19 Vaccination Clinic  Name:  Sandra Macias    MRN: [id number] DOB: 1976-05-20  02/15/2020  [name] was observed post Covid-19 immunization for 15 minutes without incident. She was provided with Vaccine Information Sheet and instruction to access the V-Safe system.   [name] was instructed to call 911 with any severe reactions post vaccine: Marland Kitchen Difficulty breathing  . Swelling of face and throat  . A fast heartbeat  . A bad rash all over body  . Dizziness and weakness   Immunizations Administered    Name Date Dose VIS Date Route   Moderna COVID-19 Vaccine 02/15/2020 11:35 AM 0.5 mL 10/26/2019 Intramuscular   Manufacturer: Moderna   LotHQ:[id number]   [place]:[id number]

## 2020-02-28 NOTE — H&P (Signed)
  Subjective:    Patient ID: Sandra Macias is a 44 y.o. female.  HPI  Here for follow up discussion prior to staged breast reconstruction, starting with breast reduction   Family history sister with breast ca diagnosed age 63, BRCA+, and 2 maternal aunts breast ca. Underwent initial genetic testing over 10 years ago which was negative. Sister experienced recurrence age 53. 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 Following this underwent updated genetic testing and found to have BRCA2 mutation. Plan for risk reducing mastectomies. Plan staged approach with bilateral reductions/mastopexies followed by NSM with expander placement.  Current DDD. Weight up 15 since time of her NHL diagnosis. Has had prior consultations with Drs. [name] and [name], including to discuss breast reduction prior to above BRCA2 diagnosis. Since last visit here had additional consultation with Dr. [name].  MRI 10/2019, MMG 05/2019  PSH significant for abdominoplasty.  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   Sister underwent initial left mastectomy with what sounds like TRAM flap reconstruction. Currently receiving chemotherapy for right breast cancer, anticipate right mastectomy, per patient invading pectoralis muscle.  Review of Systems Remainder 12 point review negative    Objective:  Physical Exam  Cardiovascular: Normal rate. Normal heart sounds Pulmonary/Chest: Effort normal. Clear to auscultation Abdominal: Soft.  Lymphadenopathy:    She has no axillary adenopathy.   Breasts: No masses Right chest port scar Grade 3 ptosis bilateral SN to nipple R 28 L 28.5 cm BW R 21 L 20 cm Nipple to IMF R 11 L 11.5 cm.    Assessment:    BRCA2 Hx NHL    Plan:    Plan bilateral breast reduction. Reviewed purpose of breast reduction first is to reduce size of breast, address ptosis and NAC position to allow her to have NSM. Reviewed  post  NSM, breasts will be asensate and not stimulate. Reviewed reduction is not necessary unless she desires NSM. Reviewed OP surgery, drains, post op visits and limitations.   Additional risks including but not limited to infection, seroma, hematoma, necrosis nipple and areola, blood clots in lungs or legs, damage to adjacent structures, unacceptable cosmetic result reviewed.  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 This has not been done as patient expecting mastectomies. Message sent to her breast surgeon to see if this is needed prior to reduction. She will also call [name].   Discussed risk COVID infectionthrough this elective surgery. Patient will receive COVID testing prior to surgery. Discussed even if patient receivesa negative test result, the tests in some cases may fail to detect the virus or patient maycontract COVID after the test.COVID 19 infectionbefore/during/aftersurgery may result in lead to a higher chance of complication and death.  Rx for Norco given

## 2020-03-02 ENCOUNTER — Other Ambulatory Visit: Payer: Self-pay

## 2020-03-02 ENCOUNTER — Encounter (HOSPITAL_BASED_OUTPATIENT_CLINIC_OR_DEPARTMENT_OTHER): Payer: Self-pay | Admitting: Plastic Surgery

## 2020-03-07 ENCOUNTER — Other Ambulatory Visit: Payer: Self-pay | Admitting: Radiology

## 2020-03-07 ENCOUNTER — Other Ambulatory Visit (HOSPITAL_COMMUNITY)
Admission: RE | Admit: 2020-03-07 | Discharge: 2020-03-07 | Disposition: A | Discharge status: Home / Self Care | Payer: 59 | Source: Ambulatory Visit | Attending: Plastic Surgery | Admitting: Plastic Surgery

## 2020-03-07 DIAGNOSIS — Z01812 Encounter for preprocedural laboratory examination: Secondary | ICD-10-CM | POA: Insufficient documentation

## 2020-03-07 DIAGNOSIS — Z20822 Contact with and (suspected) exposure to covid-19: Secondary | ICD-10-CM | POA: Insufficient documentation

## 2020-03-07 LAB — SARS CORONAVIRUS 2 (TAT 6-24 HRS): SARS Coronavirus 2: NEGATIVE

## 2020-03-07 NOTE — Progress Notes (Signed)
      Enhanced Recovery after Surgery for Orthopedics Enhanced Recovery after Surgery is a protocol used to improve the stress on your body and your recovery after surgery.  Patient Instructions  . The night before surgery:  o No food after midnight. ONLY clear liquids after midnight  . The day of surgery (if you do NOT have diabetes):  o Drink ONE (1) Pre-Surgery Clear Ensure as directed.   o This drink was given to you during your hospital  pre-op appointment visit. o The pre-op nurse will instruct you on the time to drink the  Pre-Surgery Ensure depending on your surgery time. o Finish the drink at the designated time by the pre-op nurse.  o Nothing else to drink after completing the  Pre-Surgery Clear Ensure.  . The day of surgery (if you have diabetes): o Drink ONE (1) Gatorade 2 (G2) as directed. o This drink was given to you during your hospital  pre-op appointment visit.  o The pre-op nurse will instruct you on the time to drink the   Gatorade 2 (G2) depending on your surgery time. o Color of the Gatorade may vary. Red is not allowed. o Nothing else to drink after completing the  Gatorade 2 (G2).         If you have questions, please contact your surgeon's office.  Surgical soap also given to patient with instructions for use.  Patient verbalized understanding of instructions.

## 2020-03-10 ENCOUNTER — Encounter (HOSPITAL_BASED_OUTPATIENT_CLINIC_OR_DEPARTMENT_OTHER)
Admission: RE | Disposition: A | Discharge status: Home / Self Care | Payer: Self-pay | Source: Home / Self Care | Attending: Plastic Surgery

## 2020-03-10 ENCOUNTER — Ambulatory Visit (HOSPITAL_BASED_OUTPATIENT_CLINIC_OR_DEPARTMENT_OTHER): Payer: 59 | Admitting: Anesthesiology

## 2020-03-10 ENCOUNTER — Other Ambulatory Visit: Payer: Self-pay

## 2020-03-10 ENCOUNTER — Encounter (HOSPITAL_BASED_OUTPATIENT_CLINIC_OR_DEPARTMENT_OTHER): Payer: Self-pay | Admitting: Plastic Surgery

## 2020-03-10 ENCOUNTER — Ambulatory Visit (HOSPITAL_BASED_OUTPATIENT_CLINIC_OR_DEPARTMENT_OTHER)
Admission: RE | Admit: 2020-03-10 | Discharge: 2020-03-10 | Disposition: A | Discharge status: Home / Self Care | Payer: 59 | Attending: Plastic Surgery | Admitting: Plastic Surgery

## 2020-03-10 DIAGNOSIS — Z9221 Personal history of antineoplastic chemotherapy: Secondary | ICD-10-CM | POA: Insufficient documentation

## 2020-03-10 DIAGNOSIS — Z803 Family history of malignant neoplasm of breast: Secondary | ICD-10-CM | POA: Insufficient documentation

## 2020-03-10 DIAGNOSIS — Z8572 Personal history of non-Hodgkin lymphomas: Secondary | ICD-10-CM | POA: Diagnosis not present

## 2020-03-10 DIAGNOSIS — N62 Hypertrophy of breast: Secondary | ICD-10-CM | POA: Diagnosis not present

## 2020-03-10 HISTORY — PX: BREAST REDUCTION SURGERY: SHX8

## 2020-03-10 SURGERY — MAMMOPLASTY, REDUCTION
Anesthesia: General | Site: Breast | Laterality: Bilateral

## 2020-03-10 MED ORDER — GABAPENTIN 300 MG PO CAPS
300.0000 mg | ORAL_CAPSULE | ORAL | Status: AC
  Administered 2020-03-10: 300 mg via ORAL

## 2020-03-10 MED ORDER — PHENYLEPHRINE 40 MCG/ML (10ML) SYRINGE FOR IV PUSH (FOR BLOOD PRESSURE SUPPORT)
PREFILLED_SYRINGE | INTRAVENOUS | Status: AC
  Filled 2020-03-10: qty 10

## 2020-03-10 MED ORDER — CELECOXIB 200 MG PO CAPS
ORAL_CAPSULE | ORAL | Status: AC
  Filled 2020-03-10: qty 1

## 2020-03-10 MED ORDER — OXYCODONE HCL 5 MG PO TABS
5.0000 mg | ORAL_TABLET | Freq: Once | ORAL | Status: AC | PRN
  Administered 2020-03-10: 5 mg via ORAL

## 2020-03-10 MED ORDER — LACTATED RINGERS IV SOLN: INTRAVENOUS | Status: DC

## 2020-03-10 MED ORDER — SUGAMMADEX SODIUM 200 MG/2ML IV SOLN
INTRAVENOUS | Status: DC | PRN
  Administered 2020-03-10: 200 mg via INTRAVENOUS

## 2020-03-10 MED ORDER — SODIUM CHLORIDE 0.9 % IV SOLN
INTRAVENOUS | Status: DC | PRN
  Administered 2020-03-10: 40 mL

## 2020-03-10 MED ORDER — CEFAZOLIN SODIUM-DEXTROSE 2-4 GM/100ML-% IV SOLN
2.0000 g | INTRAVENOUS | Status: AC
  Administered 2020-03-10: 08:00:00 2 g via INTRAVENOUS

## 2020-03-10 MED ORDER — HEPARIN SODIUM (PORCINE) 5000 UNIT/ML IJ SOLN
INTRAMUSCULAR | Status: AC
  Filled 2020-03-10: qty 1

## 2020-03-10 MED ORDER — ACETAMINOPHEN 500 MG PO TABS
ORAL_TABLET | ORAL | Status: AC
  Filled 2020-03-10: qty 2

## 2020-03-10 MED ORDER — CHLORHEXIDINE GLUCONATE CLOTH 2 % EX PADS: 6.0000 | MEDICATED_PAD | Freq: Once | CUTANEOUS | Status: DC

## 2020-03-10 MED ORDER — HYDROMORPHONE HCL 1 MG/ML IJ SOLN
0.2500 mg | INTRAMUSCULAR | Status: DC | PRN
  Administered 2020-03-10 (×2): 0.5 mg via INTRAVENOUS

## 2020-03-10 MED ORDER — OXYCODONE HCL 5 MG/5ML PO SOLN: 5.0000 mg | Freq: Once | ORAL | Status: AC | PRN

## 2020-03-10 MED ORDER — PROPOFOL 10 MG/ML IV BOLUS
INTRAVENOUS | Status: DC | PRN
  Administered 2020-03-10: 200 mg via INTRAVENOUS

## 2020-03-10 MED ORDER — HEPARIN SODIUM (PORCINE) 5000 UNIT/ML IJ SOLN
5000.0000 [IU] | Freq: Once | INTRAMUSCULAR | Status: AC
  Administered 2020-03-10: 07:00:00 5000 [IU] via SUBCUTANEOUS

## 2020-03-10 MED ORDER — HYDROMORPHONE HCL 1 MG/ML IJ SOLN
INTRAMUSCULAR | Status: AC
  Filled 2020-03-10: qty 0.5

## 2020-03-10 MED ORDER — ROCURONIUM BROMIDE 10 MG/ML (PF) SYRINGE
PREFILLED_SYRINGE | INTRAVENOUS | Status: AC
  Filled 2020-03-10: qty 10

## 2020-03-10 MED ORDER — CELECOXIB 200 MG PO CAPS
200.0000 mg | ORAL_CAPSULE | ORAL | Status: AC
  Administered 2020-03-10: 07:00:00 200 mg via ORAL

## 2020-03-10 MED ORDER — ROCURONIUM BROMIDE 10 MG/ML (PF) SYRINGE
PREFILLED_SYRINGE | INTRAVENOUS | Status: DC | PRN
  Administered 2020-03-10: 60 mg via INTRAVENOUS

## 2020-03-10 MED ORDER — FENTANYL CITRATE (PF) 100 MCG/2ML IJ SOLN
INTRAMUSCULAR | Status: AC
  Filled 2020-03-10: qty 2

## 2020-03-10 MED ORDER — LIDOCAINE 2% (20 MG/ML) 5 ML SYRINGE
INTRAMUSCULAR | Status: AC
  Filled 2020-03-10: qty 5

## 2020-03-10 MED ORDER — ACETAMINOPHEN 500 MG PO TABS
1000.0000 mg | ORAL_TABLET | ORAL | Status: AC
  Administered 2020-03-10: 07:00:00 1000 mg via ORAL

## 2020-03-10 MED ORDER — LIDOCAINE 2% (20 MG/ML) 5 ML SYRINGE
INTRAMUSCULAR | Status: DC | PRN
  Administered 2020-03-10: 90 mg via INTRAVENOUS

## 2020-03-10 MED ORDER — FENTANYL CITRATE (PF) 250 MCG/5ML IJ SOLN
INTRAMUSCULAR | Status: DC | PRN
  Administered 2020-03-10 (×3): 50 ug via INTRAVENOUS

## 2020-03-10 MED ORDER — 0.9 % SODIUM CHLORIDE (POUR BTL) OPTIME
TOPICAL | Status: DC | PRN
  Administered 2020-03-10: 1000 mL

## 2020-03-10 MED ORDER — PROPOFOL 10 MG/ML IV BOLUS
INTRAVENOUS | Status: AC
  Filled 2020-03-10: qty 20

## 2020-03-10 MED ORDER — PROMETHAZINE HCL 25 MG/ML IJ SOLN: 6.2500 mg | INTRAMUSCULAR | Status: DC | PRN

## 2020-03-10 MED ORDER — ONDANSETRON HCL 4 MG/2ML IJ SOLN
INTRAMUSCULAR | Status: DC | PRN
  Administered 2020-03-10: 4 mg via INTRAVENOUS

## 2020-03-10 MED ORDER — MIDAZOLAM HCL 5 MG/5ML IJ SOLN
INTRAMUSCULAR | Status: DC | PRN
  Administered 2020-03-10: 2 mg via INTRAVENOUS

## 2020-03-10 MED ORDER — OXYCODONE HCL 5 MG PO TABS
ORAL_TABLET | ORAL | Status: AC
  Filled 2020-03-10: qty 1

## 2020-03-10 MED ORDER — DEXAMETHASONE SODIUM PHOSPHATE 10 MG/ML IJ SOLN
INTRAMUSCULAR | Status: AC
  Filled 2020-03-10: qty 1

## 2020-03-10 MED ORDER — MIDAZOLAM HCL 2 MG/2ML IJ SOLN
INTRAMUSCULAR | Status: AC
  Filled 2020-03-10: qty 2

## 2020-03-10 MED ORDER — CEFAZOLIN SODIUM-DEXTROSE 2-4 GM/100ML-% IV SOLN
INTRAVENOUS | Status: AC
  Filled 2020-03-10: qty 100

## 2020-03-10 MED ORDER — SUCCINYLCHOLINE CHLORIDE 200 MG/10ML IV SOSY
PREFILLED_SYRINGE | INTRAVENOUS | Status: AC
  Filled 2020-03-10: qty 10

## 2020-03-10 MED ORDER — DEXAMETHASONE SODIUM PHOSPHATE 10 MG/ML IJ SOLN
INTRAMUSCULAR | Status: DC | PRN
  Administered 2020-03-10: 4 mg via INTRAVENOUS

## 2020-03-10 MED ORDER — GABAPENTIN 300 MG PO CAPS
ORAL_CAPSULE | ORAL | Status: AC
  Filled 2020-03-10: qty 1

## 2020-03-10 SURGICAL SUPPLY — 53 items
APPLIER CLIP 9.375 MED OPEN (MISCELLANEOUS)
BINDER BREAST 3XL (GAUZE/BANDAGES/DRESSINGS) IMPLANT
BINDER BREAST LRG (GAUZE/BANDAGES/DRESSINGS) IMPLANT
BINDER BREAST MEDIUM (GAUZE/BANDAGES/DRESSINGS) IMPLANT
BINDER BREAST XLRG (GAUZE/BANDAGES/DRESSINGS) IMPLANT
BINDER BREAST XXLRG (GAUZE/BANDAGES/DRESSINGS) IMPLANT
BLADE SURG 10 STRL SS (BLADE) ×8 IMPLANT
BNDG GAUZE ELAST 4 BULKY (GAUZE/BANDAGES/DRESSINGS) ×4 IMPLANT
CANISTER SUCT 1200ML W/VALVE (MISCELLANEOUS) ×2 IMPLANT
CHLORAPREP W/TINT 26 (MISCELLANEOUS) ×4 IMPLANT
CLIP APPLIE 9.375 MED OPEN (MISCELLANEOUS) IMPLANT
CLIP VESOCCLUDE MED 6/CT (CLIP) IMPLANT
COVER BACK TABLE 60X90IN (DRAPES) ×2 IMPLANT
COVER MAYO STAND STRL (DRAPES) ×2 IMPLANT
COVER WAND RF STERILE (DRAPES) IMPLANT
DERMABOND ADVANCED (GAUZE/BANDAGES/DRESSINGS) ×4
DERMABOND ADVANCED .7 DNX12 (GAUZE/BANDAGES/DRESSINGS) ×4 IMPLANT
DRAIN CHANNEL 15F RND FF W/TCR (WOUND CARE) ×4 IMPLANT
DRAPE TOP ARMCOVERS (MISCELLANEOUS) ×2 IMPLANT
DRAPE U-SHAPE 76X120 STRL (DRAPES) ×2 IMPLANT
DRAPE UTILITY XL STRL (DRAPES) ×2 IMPLANT
DRSG PAD ABDOMINAL 8X10 ST (GAUZE/BANDAGES/DRESSINGS) ×4 IMPLANT
ELECT COATED BLADE 2.86 ST (ELECTRODE) ×2 IMPLANT
ELECT REM PT RETURN 9FT ADLT (ELECTROSURGICAL) ×2
ELECTRODE REM PT RTRN 9FT ADLT (ELECTROSURGICAL) ×1 IMPLANT
EVACUATOR SILICONE 100CC (DRAIN) ×4 IMPLANT
GLOVE BIO SURGEON STRL SZ 6 (GLOVE) ×4 IMPLANT
GOWN STRL REUS W/ TWL LRG LVL3 (GOWN DISPOSABLE) ×2 IMPLANT
GOWN STRL REUS W/TWL LRG LVL3 (GOWN DISPOSABLE) ×4
MARKER SKIN DUAL TIP RULER LAB (MISCELLANEOUS) IMPLANT
NEEDLE HYPO 25X1 1.5 SAFETY (NEEDLE) IMPLANT
NS IRRIG 1000ML POUR BTL (IV SOLUTION) ×2 IMPLANT
PACK BASIN DAY SURGERY FS (CUSTOM PROCEDURE TRAY) ×2 IMPLANT
PENCIL SMOKE EVACUATOR (MISCELLANEOUS) ×2 IMPLANT
PIN SAFETY STERILE (MISCELLANEOUS) ×2 IMPLANT
SHEET MEDIUM DRAPE 40X70 STRL (DRAPES) ×4 IMPLANT
SLEEVE SCD COMPRESS KNEE MED (MISCELLANEOUS) ×2 IMPLANT
SPONGE LAP 18X18 RF (DISPOSABLE) ×8 IMPLANT
STAPLER VISISTAT 35W (STAPLE) ×2 IMPLANT
SUT ETHILON 2 0 FS 18 (SUTURE) ×2 IMPLANT
SUT MNCRL AB 4-0 PS2 18 (SUTURE) ×8 IMPLANT
SUT PDS AB 2-0 CT2 27 (SUTURE) IMPLANT
SUT VIC AB 3-0 PS1 18 (SUTURE) ×12
SUT VIC AB 3-0 PS1 18XBRD (SUTURE) ×6 IMPLANT
SUT VICRYL 4-0 PS2 18IN ABS (SUTURE) ×4 IMPLANT
SYR BULB IRRIGATION 50ML (SYRINGE) ×4 IMPLANT
SYR CONTROL 10ML LL (SYRINGE) IMPLANT
TAPE MEASURE VINYL STERILE (MISCELLANEOUS) IMPLANT
TOWEL GREEN STERILE FF (TOWEL DISPOSABLE) ×4 IMPLANT
TRAY FOLEY W/BAG SLVR 14FR LF (SET/KITS/TRAYS/PACK) IMPLANT
TUBE CONNECTING 20X1/4 (TUBING) ×2 IMPLANT
UNDERPAD 30X36 HEAVY ABSORB (UNDERPADS AND DIAPERS) ×4 IMPLANT
YANKAUER SUCT BULB TIP NO VENT (SUCTIONS) ×2 IMPLANT

## 2020-03-10 NOTE — Transfer of Care (Signed)
Immediate Anesthesia Transfer of Care Note  Patient: Sandra Macias  Procedure(s) Performed: MAMMARY REDUCTION  (BREAST) (Bilateral Breast)  Patient Location: PACU  Anesthesia Type:General  Level of Consciousness: awake, alert , oriented and patient cooperative  Airway & Oxygen Therapy: Patient Spontanous Breathing and Patient connected to nasal cannula oxygen  Post-op Assessment: Report given to RN, Post -op Vital signs reviewed and stable and Patient moving all extremities  Post vital signs: Reviewed and stable  Last Vitals:  Vitals Value Taken Time  BP 132/81 03/10/20 1058  Temp    Pulse 86 03/10/20 1100  Resp 18 03/10/20 1100  SpO2 100 % 03/10/20 1100  Vitals shown include unvalidated device data.  Last Pain:  Vitals:   03/10/20 0637  TempSrc: Temporal  PainSc:          Complications: No apparent anesthesia complications

## 2020-03-10 NOTE — Op Note (Signed)
Operative Note   DATE OF OPERATION: 4.16.21  LOCATION: [facility]outpatient  SURGICAL DIVISION: Plastic Surgery  PREOPERATIVE DIAGNOSES:  1. BRCA2 2. Macromastia  POSTOPERATIVE DIAGNOSES:  same  PROCEDURE:  Bilateral breast reduction  SURGEON: Irene Limbo MD MBA  ASSISTANT: none  ANESTHESIA:  General.   EBL: 400 ml  COMPLICATIONS: None immediate.   INDICATIONS FOR PROCEDURE:  The patient, Sandra Macias, is a 44 y.o. female born on [birth year], is here for staged breast reconstruction. Patient with BRCA2 mutation and plan breast reduction in preparation for nipple sparing mastectomies.   FINDINGS: Right reduction 499 g Left 501 g  DESCRIPTION OF PROCEDURE:  The patient was marked standing in the preoperative area to mark sternal notch, chest midline, anterior axillary lines, inframammary folds. The location of new nipple areolar complex was marked at level of on inframammary fold on anterior surface breast by palpation. This was marked symmetric over bilateral breasts. With aid of Wise pattern marker, location of new nipple areolar complex and vertical limbs (7cm) were markedby displacement of breasts along meridian.SQ heparin administered. The patient was taken to the operating room. SCDs were placedand IV antibiotics were given. The patient's operative site was prepped and draped in a sterile fashion. A time out was performed and all information was confirmed to be correct.   Over left breast, superomedial pedicle marked and nipple areolar complexincised with 38 mm diameter marker. Pedicle deepithlialized and developedto chest wall. Breast tissue resected over lower pole. Medial and lateral flaps developed.Additionalsuperior pole andlateral chest wall tissue excised.Breast tailor tacked closed.  I then directed attention to right breast where superomedial pedicle designed. NAC marked with 38 mm diameter marker. The pedicle was deepithelialized. Pedicle  developed until tension free rotation was possible.Breast tissue resected over lower pole. Medial and lateral flaps developed.Additionalsuperior pole andlateral chest wall tissue excised. Breast tailor tacked closed, and patient brought to upright sitting position and assessed for symmetry. Patent returned to supine position. Breast cavities irrigated and hemostasis obtained. Exparelinfiltrated throughout each breast. 15 Fr JP placed in each breast and secured with 2-0 nylon. Closure completed bilateralwith 3-0 vicryl to approximate dermis along inframammary fold and vertical limb. NAC inset with 4-0 vicryl in dermis. Skin closure completed with 4-0 monocryl subcuticular throughout. Tissue adhesive applied. Dry dressing and breast binder applied.  The patient was allowed to wake from anesthesia, extubated and taken to the recovery room in satisfactory condition.   SPECIMENS: right and left breast reduction  DRAINS: 15 Fr JP in right and left breast

## 2020-03-10 NOTE — Anesthesia Preprocedure Evaluation (Signed)
Anesthesia Evaluation  Patient identified by MRN, date of birth, ID band Patient awake    Reviewed: Allergy & Precautions, NPO status , Patient's Chart, lab work & pertinent test results  Airway Mallampati: II  TM Distance: >3 FB Neck ROM: Full    Dental no notable dental hx.    Pulmonary neg pulmonary ROS,    Pulmonary exam normal breath sounds clear to auscultation       Cardiovascular negative cardio ROS Normal cardiovascular exam Rhythm:Regular Rate:Normal     Neuro/Psych negative neurological ROS  negative psych ROS   GI/Hepatic negative GI ROS, Neg liver ROS,   Endo/Other  negative endocrine ROS  Renal/GU negative Renal ROS  negative genitourinary   Musculoskeletal negative musculoskeletal ROS (+)   Abdominal   Peds negative pediatric ROS (+)  Hematology non hodgkins lymphoma, had chemotherapy x 6 until 05-14-19   Anesthesia Other Findings   Reproductive/Obstetrics negative OB ROS                             Anesthesia Physical Anesthesia Plan  ASA: II  Anesthesia Plan: General   Post-op Pain Management:    Induction: Intravenous  PONV Risk Score and Plan: 3 and Ondansetron, Dexamethasone, Midazolam, Treatment may vary due to age or medical condition and Scopolamine patch - Pre-op  Airway Management Planned: Oral ETT  Additional Equipment:   Intra-op Plan:   Post-operative Plan: Extubation in OR  Informed Consent: I have reviewed the patients History and Physical, chart, labs and discussed the procedure including the risks, benefits and alternatives for the proposed anesthesia with the patient or authorized representative who has indicated his/her understanding and acceptance.     Dental advisory given  Plan Discussed with: CRNA and Surgeon  Anesthesia Plan Comments:         Anesthesia Quick Evaluation

## 2020-03-10 NOTE — Discharge Instructions (Signed)
Next dose of Tylenol and Ibuprofen at 1:00 PM Oxycodone given at 12:30pm today   Post Anesthesia Home Care Instructions  Activity: Get plenty of rest for the remainder of the day. A responsible individual must stay with you for 24 hours following the procedure.  For the next 24 hours, DO NOT: -Drive a car -Paediatric nurse -Drink alcoholic beverages -Take any medication unless instructed by your physician -Make any legal decisions or sign important papers.  Meals: Start with liquid foods such as gelatin or soup. Progress to regular foods as tolerated. Avoid greasy, spicy, heavy foods. If nausea and/or vomiting occur, drink only clear liquids until the nausea and/or vomiting subsides. Call your physician if vomiting continues.  Special Instructions/Symptoms: Your throat may feel dry or sore from the anesthesia or the breathing tube placed in your throat during surgery. If this causes discomfort, gargle with warm salt water. The discomfort should disappear within 24 hours.  If you had a scopolamine patch placed behind your ear for the management of post- operative nausea and/or vomiting:  1. The medication in the patch is effective for 72 hours, after which it should be removed.  Wrap patch in a tissue and discard in the trash. Wash hands thoroughly with soap and water. 2. You may remove the patch earlier than 72 hours if you experience unpleasant side effects which may include dry mouth, dizziness or visual disturbances. 3. Avoid touching the patch. Wash your hands with soap and water after contact with the patch.    About my Jackson-Pratt Bulb Drain  What is a Jackson-Pratt bulb? A Jackson-Pratt is a soft, round device used to collect drainage. It is connected to a long, thin drainage catheter, which is held in place by one or two small stiches near your surgical incision site. When the bulb is squeezed, it forms a vacuum, forcing the drainage to empty into the bulb.  Emptying the  Jackson-Pratt bulb- To empty the bulb: 1. Release the plug on the top of the bulb. 2. Pour the bulb's contents into a measuring container which your nurse will provide. 3. Record the time emptied and amount of drainage. Empty the drain(s) as often as your     doctor or nurse recommends.  Date                  Time                    Amount (Drain 1)                 Amount (Drain 2)  _____________________________________________________________________  _____________________________________________________________________  _____________________________________________________________________  _____________________________________________________________________  _____________________________________________________________________  _____________________________________________________________________  _____________________________________________________________________  _____________________________________________________________________  Squeezing the Jackson-Pratt Bulb- To squeeze the bulb: 1. Make sure the plug at the top of the bulb is open. 2. Squeeze the bulb tightly in your fist. You will hear air squeezing from the bulb. 3. Replace the plug while the bulb is squeezed. 4. Use a safety pin to attach the bulb to your clothing. This will keep the catheter from     pulling at the bulb insertion site.  When to call your doctor- Call your doctor if:  Drain site becomes red, swollen or hot.  You have a fever greater than 101 degrees F.  There is oozing at the drain site.  Drain falls out (apply a guaze bandage over the drain hole and secure it with tape).  Drainage increases daily not related to activity patterns. (You will usually  have more drainage when you are active than when you are resting.)  Drainage has a bad odor.      JP Drain [employer] this sheet to all of your post-operative appointments while you have your drains.  Please measure your drains by  CC's or ML's.  Make sure you drain and measure your JP Drains 2 or 3 times per day.  At the end of each day, add up totals for the left side and add up totals for the right side.    ( 9 am )     ( 3 pm )        ( 9 pm )                Date L  R  L  R  L  R  Total L/R

## 2020-03-10 NOTE — Interval H&P Note (Signed)
History and Physical Interval Note:  03/10/2020 6:54 AM  Sandra Macias  has presented today for surgery, with the diagnosis of BRCA2.  The various methods of treatment have been discussed with the patient and family. After consideration of risks, benefits and other options for treatment, the patient has consented to  Procedure(s): MAMMARY REDUCTION  (BREAST) (Bilateral) as a surgical intervention.  The patient's history has been reviewed, patient examined, stable for surgery.  I have reviewed the patient's chart and labs.  Questions were answered to the patient's satisfaction.    Since last exam has undergone [place] and MMG and biopsy left breast finding on MRI with benign biopsy.  Arnoldo Hooker Chancellor [place]loop

## 2020-03-10 NOTE — Anesthesia Procedure Notes (Signed)
Procedure Name: Intubation Date/Time: 03/10/2020 7:39 AM Performed by: Myna Bright, CRNA Pre-anesthesia Checklist: Patient identified, Emergency Drugs available, Suction available and Patient being monitored Patient Re-evaluated:Patient Re-evaluated prior to induction Oxygen Delivery Method: Circle system utilized Preoxygenation: Pre-oxygenation with 100% oxygen Induction Type: IV induction Ventilation: Mask ventilation without difficulty Laryngoscope Size: Mac and 3 Grade View: Grade I Tube type: Oral Tube size: 7.0 mm Number of attempts: 1 Airway Equipment and Method: Stylet Placement Confirmation: ETT inserted through vocal cords under direct vision,  positive ETCO2 and breath sounds checked- equal and bilateral Secured at: 21 cm Tube secured with: Tape Dental Injury: Teeth and Oropharynx as per pre-operative assessment

## 2020-03-10 NOTE — Anesthesia Postprocedure Evaluation (Signed)
Anesthesia Post Note  Patient: Sandra Macias  Procedure(s) Performed: MAMMARY REDUCTION  (BREAST) (Bilateral Breast)     Patient location during evaluation: PACU Anesthesia Type: General Level of consciousness: awake and alert Pain management: pain level controlled Vital Signs Assessment: post-procedure vital signs reviewed and stable Respiratory status: spontaneous breathing, nonlabored ventilation, respiratory function stable and patient connected to nasal cannula oxygen Cardiovascular status: blood pressure returned to baseline and stable Postop Assessment: no apparent nausea or vomiting Anesthetic complications: no    Last Vitals:  Vitals:   03/10/20 1130 03/10/20 1145  BP: 117/74 109/75  Pulse: 83 84  Resp: 12 12  Temp:    SpO2: 100% 100%    Last Pain:  Vitals:   03/10/20 1136  TempSrc:   PainSc: 7                  [name] S

## 2020-03-13 LAB — SURGICAL PATHOLOGY

## 2020-03-14 ENCOUNTER — Encounter: Payer: Self-pay | Admitting: *Deleted

## 2020-04-11 IMAGING — CT CT NECK W/ CM
3 of 5 series · 12 of 33 positions shown, 14 images · IV contrast (iopamidol)
Comparison: None.

CLINICAL DATA: Left neck swelling.  Multiple neck mass.

EXAM:
CT NECK WITH CONTRAST
TECHNIQUE: Multidetector CT imaging of the neck was performed using the
standard protocol following the bolus administration of intravenous
contrast.
CONTRAST:  75mL FNOG6L-GSS IOPAMIDOL (FNOG6L-GSS) INJECTION 61%

[Series 7: sag neck · sagittal · 0.60mm/px · 5 of 101 slices shown, 6 images]
[im 34/101  bone]
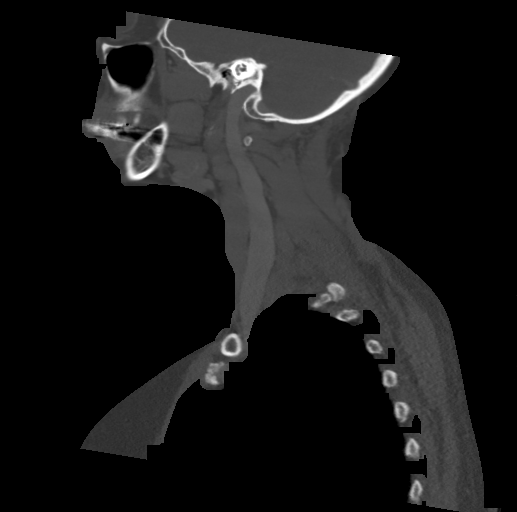
[im 42/101  bone]
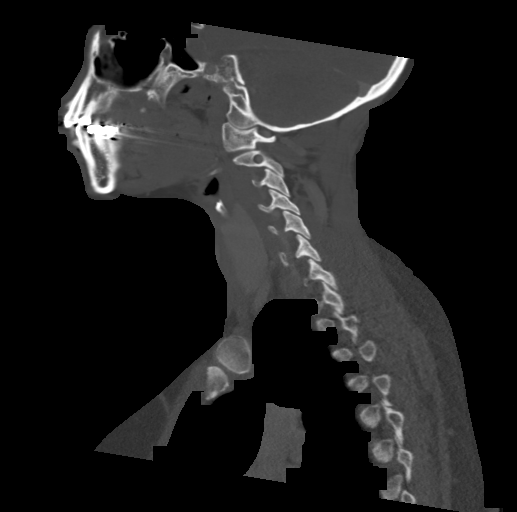
[im 51/101  soft-tissue]
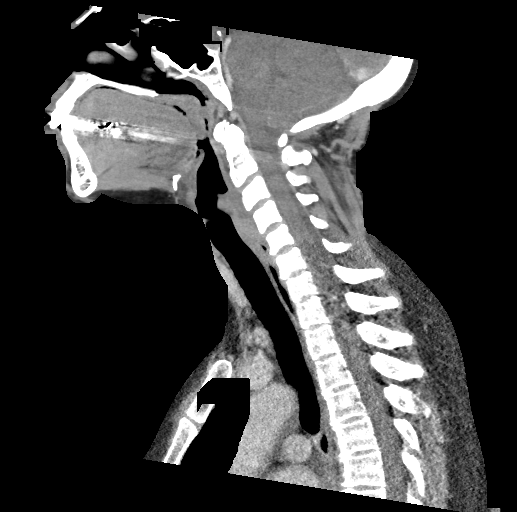
[im 51/101  bone]
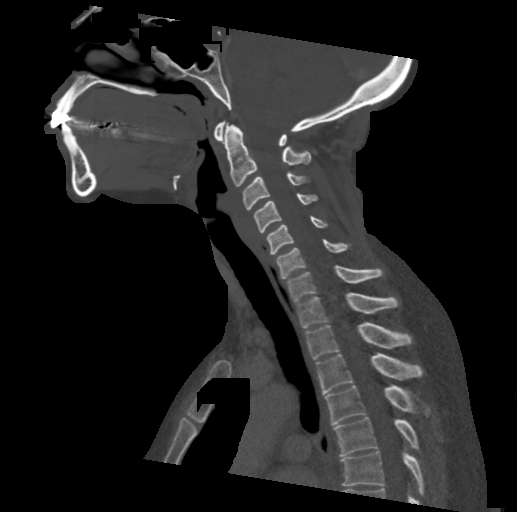
[im 59/101  bone]
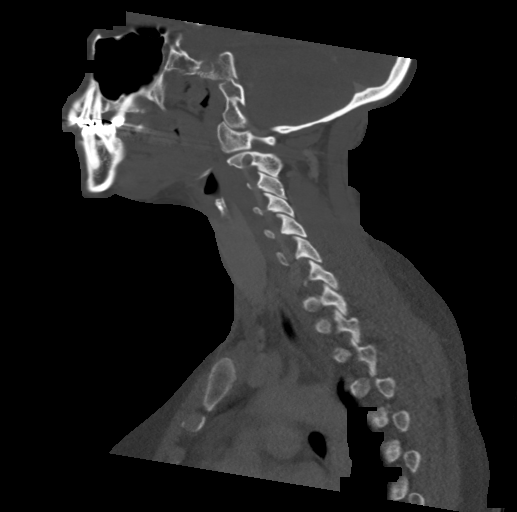
[im 67/101  bone]
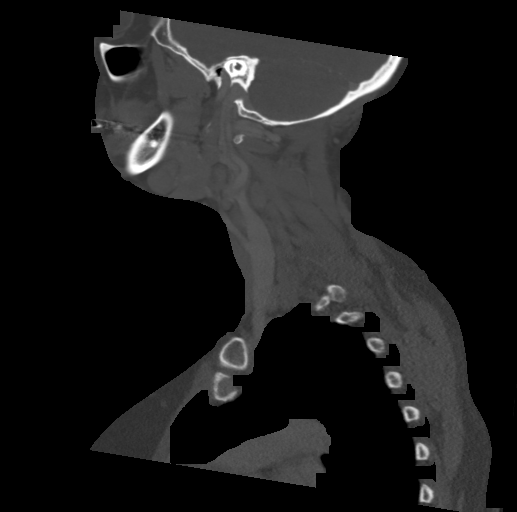

[Series 8: cor neck · coronal · 0.39mm/px · 3 of 153 slices shown]
[im 32/153  bone]
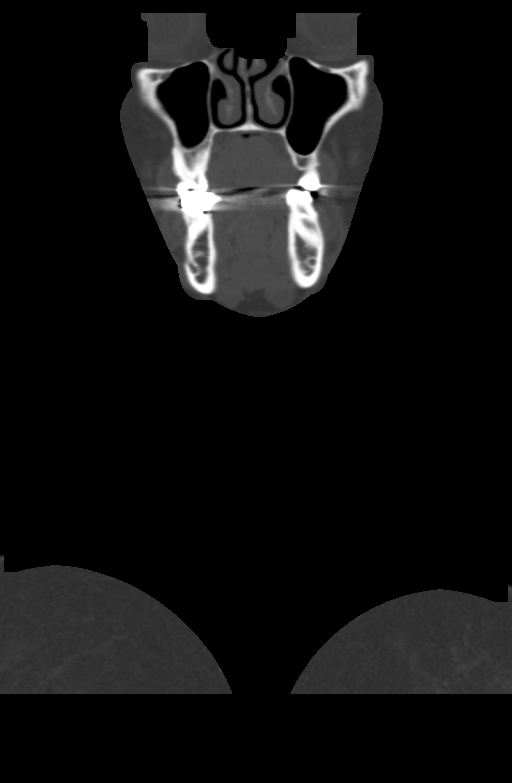
[im 62/153  bone]
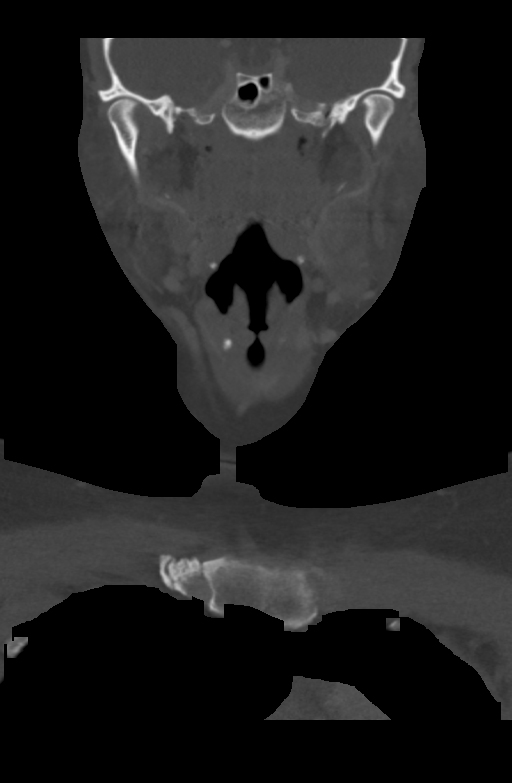
[im 92/153  bone]
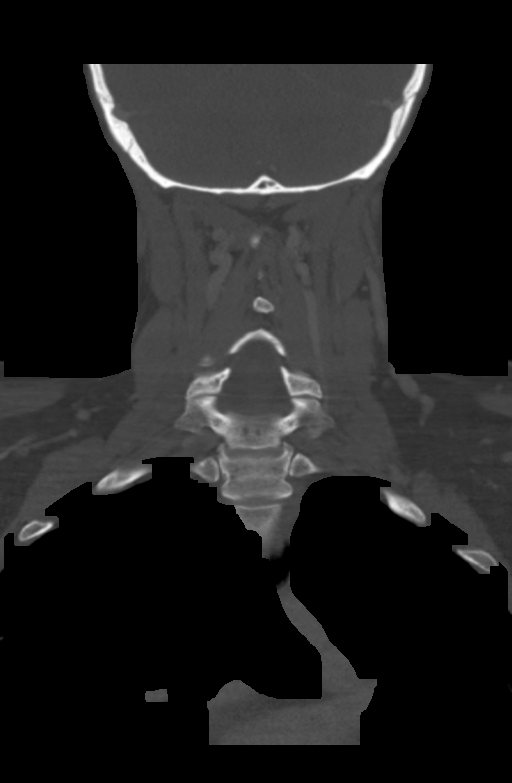

[Series 9: orthogonal ax · axial · 0.39mm/px · z∈[-415,-235]mm · 4 of 153 slices shown, 5 images]
[im 31/153  soft-tissue]
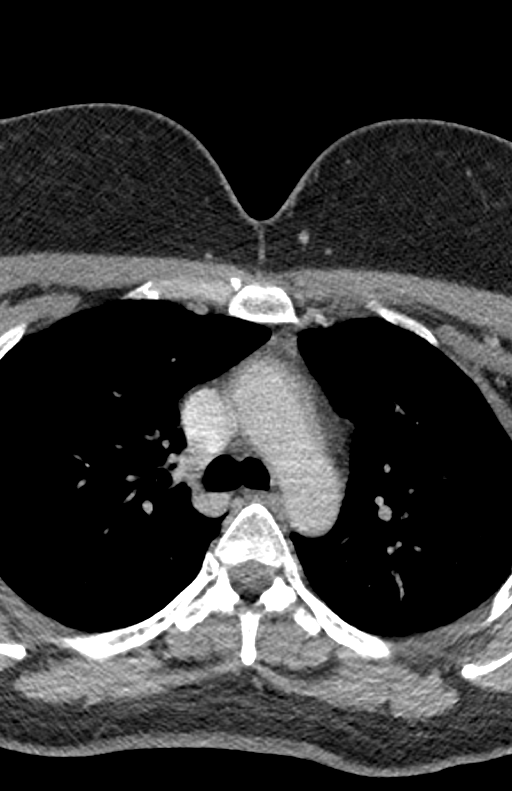
[im 31/153  bone]
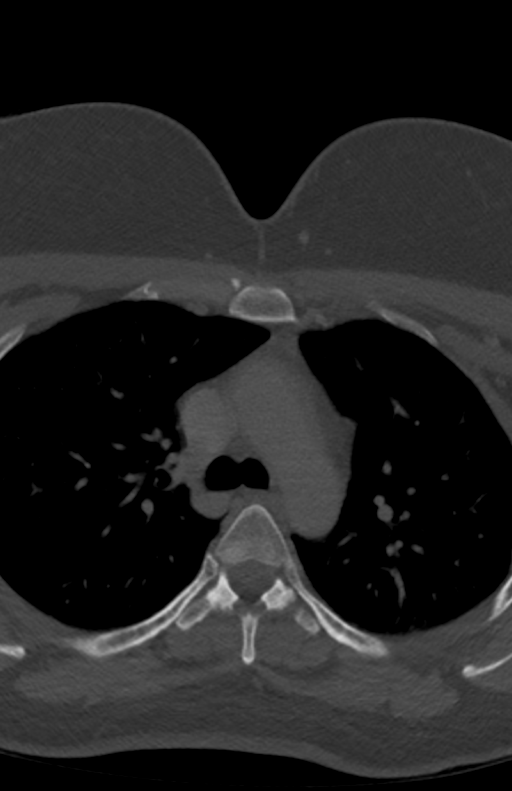
[im 61/153  bone]
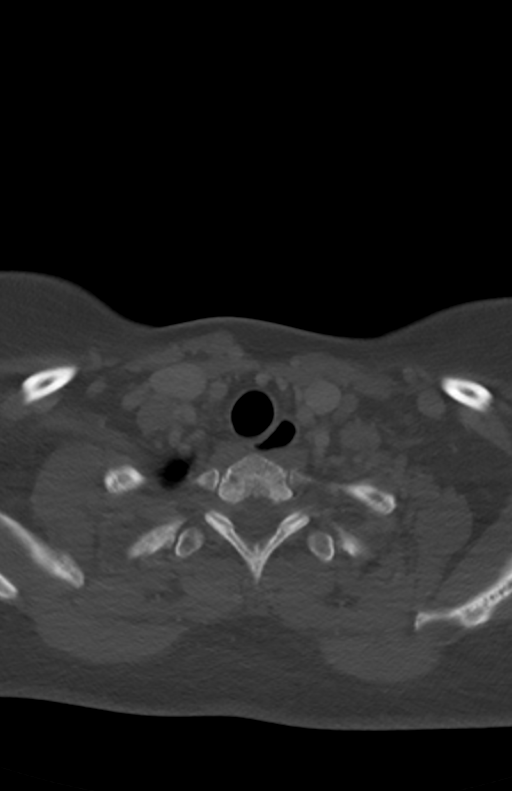
[im 92/153  bone]
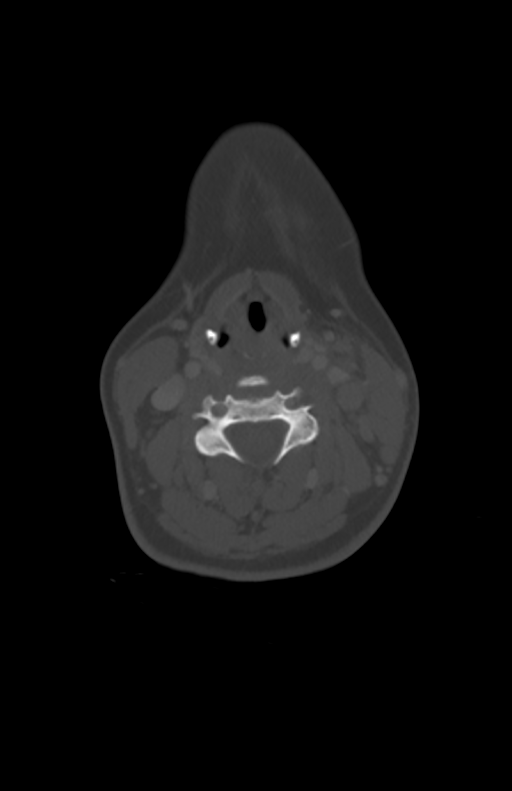
[im 122/153  bone]
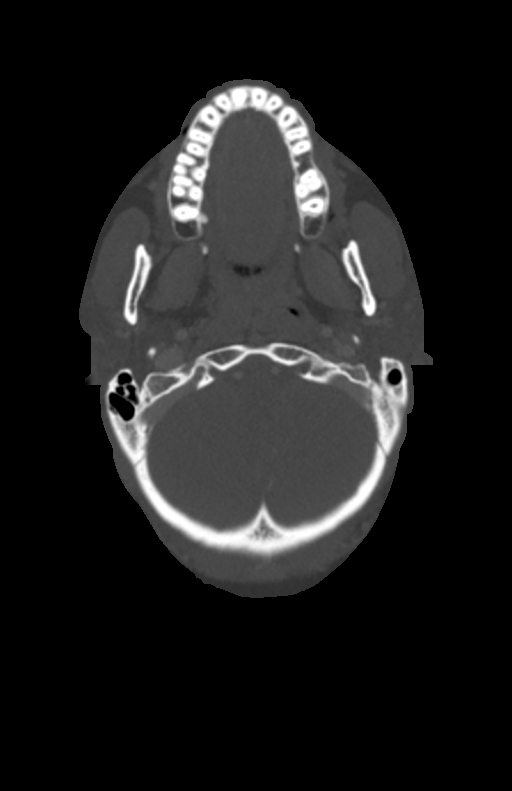

[12 of 33 positions shown; findings below may reference images not displayed]

FINDINGS: Pharynx and larynx: Normal. No mass or swelling.

Salivary glands: No inflammation, mass, or stone.

Thyroid: Mild thyroid enlargement with tiny nodules bilaterally.

Lymph nodes: Multiple enlarged lymph nodes in the left neck. 16 mm
left submandibular node, 12 mm left submandibular node. Left level 2
node 14 mm and 15 mm. 17 mm lymph node posterior to the left jugular
vein. Many of these nodes have ill-defined margins with stranding in
the surrounding fat. Additional subcentimeter posterior lymph nodes
on the left. 8 mm and 10 mm supraclavicular level 5 nodes on the
left.

Right level 2 lymph node 10 mm.  No pathologic nodes on the right.

Vascular: Normal vascular enhancement

Limited intracranial: Negative

Visualized orbits: Negative

Mastoids and visualized paranasal sinuses: Negative

Skeleton: Negative

Upper chest: Negative

Other: None
IMPRESSION: Enlarged lymph nodes in the left neck with ill-defined margins
suggesting tumor.. No primary tumor in the neck. Recommend direct
mucosal inspection and tissue sampling.

## 2020-04-19 ENCOUNTER — Other Ambulatory Visit: Payer: Self-pay | Admitting: General Surgery

## 2020-06-19 ENCOUNTER — Encounter (HOSPITAL_BASED_OUTPATIENT_CLINIC_OR_DEPARTMENT_OTHER): Payer: Self-pay | Admitting: General Surgery

## 2020-06-19 ENCOUNTER — Other Ambulatory Visit: Payer: Self-pay

## 2020-06-23 ENCOUNTER — Other Ambulatory Visit (HOSPITAL_COMMUNITY)
Admission: RE | Admit: 2020-06-23 | Discharge: 2020-06-23 | Disposition: A | Discharge status: Home / Self Care | Payer: 59 | Source: Ambulatory Visit | Attending: General Surgery | Admitting: General Surgery

## 2020-06-23 DIAGNOSIS — Z20822 Contact with and (suspected) exposure to covid-19: Secondary | ICD-10-CM | POA: Insufficient documentation

## 2020-06-23 DIAGNOSIS — Z01812 Encounter for preprocedural laboratory examination: Secondary | ICD-10-CM | POA: Insufficient documentation

## 2020-06-23 LAB — SARS CORONAVIRUS 2 (TAT 6-24 HRS): SARS Coronavirus 2: NEGATIVE

## 2020-06-23 MED ORDER — ENSURE PRE-SURGERY PO LIQD: 296.0000 mL | Freq: Once | ORAL | Status: DC

## 2020-06-23 NOTE — Progress Notes (Signed)

## 2020-06-27 ENCOUNTER — Other Ambulatory Visit: Payer: Self-pay

## 2020-06-27 ENCOUNTER — Encounter (HOSPITAL_BASED_OUTPATIENT_CLINIC_OR_DEPARTMENT_OTHER)
Admission: RE | Disposition: A | Discharge status: Home / Self Care | Payer: Self-pay | Source: Home / Self Care | Attending: General Surgery

## 2020-06-27 ENCOUNTER — Observation Stay (HOSPITAL_BASED_OUTPATIENT_CLINIC_OR_DEPARTMENT_OTHER)
Admission: RE | Admit: 2020-06-27 | Discharge: 2020-06-28 | Disposition: A | Discharge status: Home / Self Care | Payer: 59 | Attending: Plastic Surgery | Admitting: Plastic Surgery

## 2020-06-27 ENCOUNTER — Encounter (HOSPITAL_BASED_OUTPATIENT_CLINIC_OR_DEPARTMENT_OTHER): Payer: Self-pay | Admitting: General Surgery

## 2020-06-27 ENCOUNTER — Ambulatory Visit (HOSPITAL_BASED_OUTPATIENT_CLINIC_OR_DEPARTMENT_OTHER): Payer: 59 | Admitting: Anesthesiology

## 2020-06-27 DIAGNOSIS — Z1501 Genetic susceptibility to malignant neoplasm of breast: Principal | ICD-10-CM | POA: Diagnosis present

## 2020-06-27 DIAGNOSIS — Z1509 Genetic susceptibility to other malignant neoplasm: Secondary | ICD-10-CM | POA: Diagnosis present

## 2020-06-27 HISTORY — PX: NIPPLE SPARING MASTECTOMY: SHX6537

## 2020-06-27 HISTORY — PX: BREAST RECONSTRUCTION WITH PLACEMENT OF TISSUE EXPANDER AND ALLODERM: SHX6805

## 2020-06-27 LAB — POCT PREGNANCY, URINE: Preg Test, Ur: NEGATIVE

## 2020-06-27 SURGERY — MASTECTOMY, NIPPLE SPARING
Anesthesia: General | Site: Breast | Laterality: Bilateral

## 2020-06-27 MED ORDER — ALBUMIN HUMAN 5 % IV SOLN
INTRAVENOUS | Status: AC
  Filled 2020-06-27: qty 500

## 2020-06-27 MED ORDER — PROMETHAZINE HCL 25 MG/ML IJ SOLN: 6.2500 mg | INTRAMUSCULAR | Status: DC | PRN

## 2020-06-27 MED ORDER — MIDAZOLAM HCL 2 MG/2ML IJ SOLN
2.0000 mg | Freq: Once | INTRAMUSCULAR | Status: AC
  Administered 2020-06-27: 2 mg via INTRAVENOUS

## 2020-06-27 MED ORDER — FENTANYL CITRATE (PF) 100 MCG/2ML IJ SOLN
100.0000 ug | Freq: Once | INTRAMUSCULAR | Status: AC
  Administered 2020-06-27: 100 ug via INTRAVENOUS

## 2020-06-27 MED ORDER — HYDROMORPHONE HCL 1 MG/ML IJ SOLN
0.2500 mg | INTRAMUSCULAR | Status: DC | PRN
  Administered 2020-06-27 (×3): 0.5 mg via INTRAVENOUS

## 2020-06-27 MED ORDER — SUGAMMADEX SODIUM 200 MG/2ML IV SOLN
INTRAVENOUS | Status: DC | PRN
  Administered 2020-06-27: 200 mg via INTRAVENOUS

## 2020-06-27 MED ORDER — KETOROLAC TROMETHAMINE 15 MG/ML IJ SOLN
15.0000 mg | Freq: Four times a day (QID) | INTRAMUSCULAR | Status: DC | PRN
  Administered 2020-06-27: 15 mg via INTRAVENOUS
  Filled 2020-06-27: qty 1

## 2020-06-27 MED ORDER — METHOCARBAMOL 500 MG PO TABS
500.0000 mg | ORAL_TABLET | Freq: Three times a day (TID) | ORAL | Status: DC
  Administered 2020-06-27 – 2020-06-28 (×2): 500 mg via ORAL
  Filled 2020-06-27 (×2): qty 1

## 2020-06-27 MED ORDER — PROPOFOL 500 MG/50ML IV EMUL
INTRAVENOUS | Status: AC
  Filled 2020-06-27: qty 50

## 2020-06-27 MED ORDER — MORPHINE SULFATE (PF) 4 MG/ML IV SOLN: 1.0000 mg | INTRAVENOUS | Status: DC | PRN

## 2020-06-27 MED ORDER — ACETAMINOPHEN 500 MG PO TABS
1000.0000 mg | ORAL_TABLET | ORAL | Status: AC
  Administered 2020-06-27: 1000 mg via ORAL

## 2020-06-27 MED ORDER — MEPERIDINE HCL 25 MG/ML IJ SOLN: 6.2500 mg | INTRAMUSCULAR | Status: DC | PRN

## 2020-06-27 MED ORDER — LIDOCAINE 2% (20 MG/ML) 5 ML SYRINGE
INTRAMUSCULAR | Status: AC
  Filled 2020-06-27: qty 5

## 2020-06-27 MED ORDER — OXYCODONE HCL 5 MG PO TABS
5.0000 mg | ORAL_TABLET | ORAL | Status: DC | PRN
  Administered 2020-06-27: 10 mg via ORAL
  Administered 2020-06-28 (×2): 5 mg via ORAL
  Filled 2020-06-27: qty 2
  Filled 2020-06-27 (×2): qty 1

## 2020-06-27 MED ORDER — OXYCODONE HCL 5 MG/5ML PO SOLN: 5.0000 mg | Freq: Once | ORAL | Status: DC | PRN

## 2020-06-27 MED ORDER — FENTANYL CITRATE (PF) 100 MCG/2ML IJ SOLN
INTRAMUSCULAR | Status: AC
  Filled 2020-06-27: qty 2

## 2020-06-27 MED ORDER — HYDROMORPHONE HCL 1 MG/ML IJ SOLN
INTRAMUSCULAR | Status: AC
  Filled 2020-06-27: qty 0.5

## 2020-06-27 MED ORDER — ACETAMINOPHEN 500 MG PO TABS
ORAL_TABLET | ORAL | Status: AC
  Filled 2020-06-27: qty 2

## 2020-06-27 MED ORDER — OXYCODONE HCL 5 MG PO TABS: 5.0000 mg | ORAL_TABLET | Freq: Once | ORAL | Status: DC | PRN

## 2020-06-27 MED ORDER — BUPIVACAINE HCL (PF) 0.25 % IJ SOLN
INTRAMUSCULAR | Status: AC
  Filled 2020-06-27: qty 30

## 2020-06-27 MED ORDER — GABAPENTIN 300 MG PO CAPS
300.0000 mg | ORAL_CAPSULE | Freq: Three times a day (TID) | ORAL | Status: DC
  Administered 2020-06-27 – 2020-06-28 (×2): 300 mg via ORAL
  Filled 2020-06-27 (×2): qty 1

## 2020-06-27 MED ORDER — LORATADINE 10 MG PO TABS: 10.0000 mg | ORAL_TABLET | Freq: Every day | ORAL | Status: DC

## 2020-06-27 MED ORDER — GABAPENTIN 100 MG PO CAPS
100.0000 mg | ORAL_CAPSULE | ORAL | Status: AC
  Administered 2020-06-27: 100 mg via ORAL

## 2020-06-27 MED ORDER — SULFAMETHOXAZOLE-TRIMETHOPRIM 800-160 MG PO TABS
1.0000 | ORAL_TABLET | Freq: Two times a day (BID) | ORAL | 0 refills | Status: DC
Start: 2020-06-27 — End: 2020-08-29

## 2020-06-27 MED ORDER — MIDAZOLAM HCL 2 MG/2ML IJ SOLN
INTRAMUSCULAR | Status: AC
  Filled 2020-06-27: qty 2

## 2020-06-27 MED ORDER — DEXAMETHASONE SODIUM PHOSPHATE 10 MG/ML IJ SOLN
INTRAMUSCULAR | Status: AC
  Filled 2020-06-27: qty 1

## 2020-06-27 MED ORDER — ROCURONIUM BROMIDE 10 MG/ML (PF) SYRINGE
PREFILLED_SYRINGE | INTRAVENOUS | Status: AC
  Filled 2020-06-27: qty 10

## 2020-06-27 MED ORDER — ALBUMIN HUMAN 5 % IV SOLN
INTRAVENOUS | Status: DC | PRN
Start: 2020-06-27 — End: 2020-06-27

## 2020-06-27 MED ORDER — ONDANSETRON 4 MG PO TBDP: 4.0000 mg | ORAL_TABLET | Freq: Four times a day (QID) | ORAL | Status: DC | PRN

## 2020-06-27 MED ORDER — LACTATED RINGERS IV SOLN: INTRAVENOUS | Status: DC

## 2020-06-27 MED ORDER — CEFAZOLIN SODIUM-DEXTROSE 2-4 GM/100ML-% IV SOLN
INTRAVENOUS | Status: AC
  Filled 2020-06-27: qty 100

## 2020-06-27 MED ORDER — ONDANSETRON HCL 4 MG/2ML IJ SOLN
INTRAMUSCULAR | Status: DC | PRN
  Administered 2020-06-27: 4 mg via INTRAVENOUS

## 2020-06-27 MED ORDER — 0.9 % SODIUM CHLORIDE (POUR BTL) OPTIME
TOPICAL | Status: DC | PRN
  Administered 2020-06-27: 1000 mL

## 2020-06-27 MED ORDER — ONDANSETRON HCL 4 MG/2ML IJ SOLN
INTRAMUSCULAR | Status: AC
  Filled 2020-06-27: qty 2

## 2020-06-27 MED ORDER — AMISULPRIDE (ANTIEMETIC) 5 MG/2ML IV SOLN: 10.0000 mg | Freq: Once | INTRAVENOUS | Status: DC | PRN

## 2020-06-27 MED ORDER — KETOROLAC TROMETHAMINE 15 MG/ML IJ SOLN
15.0000 mg | INTRAMUSCULAR | Status: AC
  Administered 2020-06-27: 15 mg via INTRAVENOUS

## 2020-06-27 MED ORDER — PROPOFOL 10 MG/ML IV BOLUS
INTRAVENOUS | Status: AC
  Filled 2020-06-27: qty 20

## 2020-06-27 MED ORDER — FENTANYL CITRATE (PF) 100 MCG/2ML IJ SOLN
INTRAMUSCULAR | Status: DC | PRN
  Administered 2020-06-27: 50 ug via INTRAVENOUS
  Administered 2020-06-27: 100 ug via INTRAVENOUS
  Administered 2020-06-27: 50 ug via INTRAVENOUS

## 2020-06-27 MED ORDER — POVIDONE-IODINE 10 % EX SOLN
CUTANEOUS | Status: DC | PRN
  Administered 2020-06-27: 1 via TOPICAL

## 2020-06-27 MED ORDER — ROCURONIUM BROMIDE 100 MG/10ML IV SOLN
INTRAVENOUS | Status: DC | PRN
  Administered 2020-06-27: 20 mg via INTRAVENOUS
  Administered 2020-06-27: 100 mg via INTRAVENOUS
  Administered 2020-06-27 (×2): 20 mg via INTRAVENOUS

## 2020-06-27 MED ORDER — ACETAMINOPHEN 500 MG PO TABS
1000.0000 mg | ORAL_TABLET | Freq: Four times a day (QID) | ORAL | Status: DC
  Administered 2020-06-27 – 2020-06-28 (×3): 1000 mg via ORAL
  Filled 2020-06-27 (×3): qty 2

## 2020-06-27 MED ORDER — CEFAZOLIN SODIUM-DEXTROSE 2-4 GM/100ML-% IV SOLN
2.0000 g | INTRAVENOUS | Status: AC
  Administered 2020-06-27: 2 g via INTRAVENOUS

## 2020-06-27 MED ORDER — PROPOFOL 500 MG/50ML IV EMUL
INTRAVENOUS | Status: DC | PRN
Start: 2020-06-27 — End: 2020-06-27
  Administered 2020-06-27: 25 ug/kg/min via INTRAVENOUS

## 2020-06-27 MED ORDER — ONDANSETRON HCL 4 MG/2ML IJ SOLN: 4.0000 mg | Freq: Four times a day (QID) | INTRAMUSCULAR | Status: DC | PRN

## 2020-06-27 MED ORDER — METHOCARBAMOL 500 MG PO TABS: 500.0000 mg | ORAL_TABLET | Freq: Three times a day (TID) | ORAL | 0 refills | Status: DC | PRN

## 2020-06-27 MED ORDER — DEXAMETHASONE SODIUM PHOSPHATE 4 MG/ML IJ SOLN
INTRAMUSCULAR | Status: DC | PRN
  Administered 2020-06-27: 10 mg via INTRAVENOUS

## 2020-06-27 MED ORDER — GABAPENTIN 100 MG PO CAPS
ORAL_CAPSULE | ORAL | Status: AC
  Filled 2020-06-27: qty 1

## 2020-06-27 MED ORDER — CEFAZOLIN SODIUM-DEXTROSE 2-4 GM/100ML-% IV SOLN
2.0000 g | Freq: Three times a day (TID) | INTRAVENOUS | Status: DC
  Administered 2020-06-27 – 2020-06-28 (×2): 2 g via INTRAVENOUS
  Filled 2020-06-27 (×2): qty 100

## 2020-06-27 MED ORDER — PROPOFOL 10 MG/ML IV BOLUS
INTRAVENOUS | Status: DC | PRN
  Administered 2020-06-27: 150 mg via INTRAVENOUS

## 2020-06-27 MED ORDER — SIMETHICONE 80 MG PO CHEW: 40.0000 mg | CHEWABLE_TABLET | Freq: Four times a day (QID) | ORAL | Status: DC | PRN

## 2020-06-27 MED ORDER — LIDOCAINE HCL (CARDIAC) PF 100 MG/5ML IV SOSY
PREFILLED_SYRINGE | INTRAVENOUS | Status: DC | PRN
  Administered 2020-06-27: 40 mg via INTRAVENOUS

## 2020-06-27 MED ORDER — SODIUM CHLORIDE 0.9 % IV SOLN: INTRAVENOUS | Status: DC

## 2020-06-27 MED ORDER — KETOROLAC TROMETHAMINE 15 MG/ML IJ SOLN
INTRAMUSCULAR | Status: AC
  Filled 2020-06-27: qty 1

## 2020-06-27 MED ORDER — SODIUM CHLORIDE 0.9 % IV SOLN
INTRAVENOUS | Status: DC | PRN
  Administered 2020-06-27: 1000 mL

## 2020-06-27 MED ORDER — BUPIVACAINE HCL (PF) 0.25 % IJ SOLN
INTRAMUSCULAR | Status: DC | PRN
  Administered 2020-06-27: 60 mL

## 2020-06-27 SURGICAL SUPPLY — 101 items
ALLOGRAFT PERF 16X20 1.6+/-0.4 (Tissue) ×4 IMPLANT
APPLIER CLIP 11 MED OPEN (CLIP)
APPLIER CLIP 9.375 MED OPEN (MISCELLANEOUS) ×2
BAG DECANTER FOR FLEXI CONT (MISCELLANEOUS) ×2 IMPLANT
BENZOIN TINCTURE PRP APPL 2/3 (GAUZE/BANDAGES/DRESSINGS) IMPLANT
BINDER BREAST LRG (GAUZE/BANDAGES/DRESSINGS) IMPLANT
BINDER BREAST MEDIUM (GAUZE/BANDAGES/DRESSINGS) IMPLANT
BINDER BREAST XLRG (GAUZE/BANDAGES/DRESSINGS) ×2 IMPLANT
BINDER BREAST XXLRG (GAUZE/BANDAGES/DRESSINGS) IMPLANT
BLADE CLIPPER SURG (BLADE) IMPLANT
BLADE HEX COATED 2.75 (ELECTRODE) IMPLANT
BLADE SURG 10 STRL SS (BLADE) ×4 IMPLANT
BLADE SURG 15 STRL LF DISP TIS (BLADE) ×1 IMPLANT
BLADE SURG 15 STRL SS (BLADE) ×2
BNDG GAUZE ELAST 4 BULKY (GAUZE/BANDAGES/DRESSINGS) ×4 IMPLANT
CANISTER SUCT 1200ML W/VALVE (MISCELLANEOUS) ×4 IMPLANT
CHLORAPREP W/TINT 26 (MISCELLANEOUS) ×4 IMPLANT
CLIP APPLIE 11 MED OPEN (CLIP) IMPLANT
CLIP APPLIE 9.375 MED OPEN (MISCELLANEOUS) ×1 IMPLANT
CLIP VESOCCLUDE SM WIDE 6/CT (CLIP) IMPLANT
COUNTER NEEDLE 1200 MAGNETIC (NEEDLE) IMPLANT
COVER BACK TABLE 60X90IN (DRAPES) ×2 IMPLANT
COVER MAYO STAND STRL (DRAPES) ×4 IMPLANT
COVER PROBE W GEL 5X96 (DRAPES) IMPLANT
COVER WAND RF STERILE (DRAPES) IMPLANT
DERMABOND ADVANCED (GAUZE/BANDAGES/DRESSINGS) ×2
DERMABOND ADVANCED .7 DNX12 (GAUZE/BANDAGES/DRESSINGS) ×2 IMPLANT
DRAIN CHANNEL 15F RND FF W/TCR (WOUND CARE) IMPLANT
DRAIN CHANNEL 19F RND (DRAIN) IMPLANT
DRAPE INCISE IOBAN 66X45 STRL (DRAPES) ×2 IMPLANT
DRAPE TOP ARMCOVERS (MISCELLANEOUS) ×2 IMPLANT
DRAPE U-SHAPE 76X120 STRL (DRAPES) ×4 IMPLANT
DRAPE UTILITY XL STRL (DRAPES) ×4 IMPLANT
DRSG PAD ABDOMINAL 8X10 ST (GAUZE/BANDAGES/DRESSINGS) ×4 IMPLANT
DRSG TEGADERM 4X10 (GAUZE/BANDAGES/DRESSINGS) ×6 IMPLANT
DRSG TEGADERM 4X4.75 (GAUZE/BANDAGES/DRESSINGS) IMPLANT
ELECT BLADE 4.0 EZ CLEAN MEGAD (MISCELLANEOUS) ×2
ELECT BLADE 6.5 EXT (BLADE) IMPLANT
ELECT COATED BLADE 2.86 ST (ELECTRODE) ×2 IMPLANT
ELECT REM PT RETURN 9FT ADLT (ELECTROSURGICAL) ×2
ELECTRODE BLDE 4.0 EZ CLN MEGD (MISCELLANEOUS) ×1 IMPLANT
ELECTRODE REM PT RTRN 9FT ADLT (ELECTROSURGICAL) ×1 IMPLANT
EVACUATOR SILICONE 100CC (DRAIN) IMPLANT
EXPANDER TISSUE FV FOURTE 500 (Prosthesis & Implant Plastic) ×2 IMPLANT
GAUZE SPONGE 4X4 12PLY STRL (GAUZE/BANDAGES/DRESSINGS) ×2 IMPLANT
GLOVE BIO SURGEON STRL SZ 6 (GLOVE) ×8 IMPLANT
GLOVE BIO SURGEON STRL SZ 6.5 (GLOVE) ×8 IMPLANT
GLOVE BIO SURGEON STRL SZ7 (GLOVE) ×4 IMPLANT
GLOVE BIOGEL PI IND STRL 6.5 (GLOVE) ×2 IMPLANT
GLOVE BIOGEL PI IND STRL 7.0 (GLOVE) ×1 IMPLANT
GLOVE BIOGEL PI IND STRL 7.5 (GLOVE) ×1 IMPLANT
GLOVE BIOGEL PI INDICATOR 6.5 (GLOVE) ×2
GLOVE BIOGEL PI INDICATOR 7.0 (GLOVE) ×1
GLOVE BIOGEL PI INDICATOR 7.5 (GLOVE) ×1
GLOVE ECLIPSE 6.5 STRL STRAW (GLOVE) ×2 IMPLANT
GOWN STRL REUS W/ TWL LRG LVL3 (GOWN DISPOSABLE) ×5 IMPLANT
GOWN STRL REUS W/TWL LRG LVL3 (GOWN DISPOSABLE) ×10
ILLUMINATOR WAVEGUIDE N/F (MISCELLANEOUS) IMPLANT
IV NS 500ML (IV SOLUTION)
IV NS 500ML BAXH (IV SOLUTION) IMPLANT
KIT FILL SYSTEM UNIVERSAL (SET/KITS/TRAYS/PACK) IMPLANT
KIT MARKER MARGIN INK (KITS) IMPLANT
LIGHT WAVEGUIDE WIDE FLAT (MISCELLANEOUS) ×2 IMPLANT
MARKER SKIN DUAL TIP RULER LAB (MISCELLANEOUS) IMPLANT
NDL SAFETY ECLIPSE 18X1.5 (NEEDLE) IMPLANT
NEEDLE HYPO 18GX1.5 SHARP (NEEDLE)
NEEDLE HYPO 25X1 1.5 SAFETY (NEEDLE) IMPLANT
NS IRRIG 1000ML POUR BTL (IV SOLUTION) ×4 IMPLANT
PACK BASIN DAY SURGERY FS (CUSTOM PROCEDURE TRAY) ×2 IMPLANT
PENCIL SMOKE EVACUATOR (MISCELLANEOUS) ×2 IMPLANT
PIN SAFETY STERILE (MISCELLANEOUS) IMPLANT
PUNCH BIOPSY DERMAL 4MM (MISCELLANEOUS) IMPLANT
SHEET MEDIUM DRAPE 40X70 STRL (DRAPES) ×2 IMPLANT
SLEEVE SCD COMPRESS KNEE MED (MISCELLANEOUS) ×2 IMPLANT
SPONGE LAP 18X18 RF (DISPOSABLE) ×14 IMPLANT
STAPLER VISISTAT 35W (STAPLE) ×2 IMPLANT
STRIP CLOSURE SKIN 1/2X4 (GAUZE/BANDAGES/DRESSINGS) IMPLANT
SUT CHROMIC 4 0 PS 2 18 (SUTURE) ×14 IMPLANT
SUT ETHIBOND 2-0 V-5 NEEDLE (SUTURE) IMPLANT
SUT ETHILON 2 0 FS 18 (SUTURE) IMPLANT
SUT ETHILON 3 0 PS 1 (SUTURE) ×4 IMPLANT
SUT MNCRL AB 3-0 PS2 18 (SUTURE) IMPLANT
SUT MNCRL AB 4-0 PS2 18 (SUTURE) ×4 IMPLANT
SUT PDS AB 2-0 CT2 27 (SUTURE) IMPLANT
SUT SILK 2 0 SH (SUTURE) ×2 IMPLANT
SUT VIC AB 3-0 SH 27 (SUTURE) ×8
SUT VIC AB 3-0 SH 27X BRD (SUTURE) ×4 IMPLANT
SUT VICRYL 0 CT-2 (SUTURE) ×8 IMPLANT
SUT VICRYL 4-0 PS2 18IN ABS (SUTURE) ×4 IMPLANT
SUT VLOC 180 0 24IN GS25 (SUTURE) ×4 IMPLANT
SYR 50ML LL SCALE MARK (SYRINGE) ×2 IMPLANT
SYR BULB IRRIG 60ML STRL (SYRINGE) ×2 IMPLANT
SYR CONTROL 10ML LL (SYRINGE) IMPLANT
TAPE MEASURE VINYL STERILE (MISCELLANEOUS) IMPLANT
TISSUE EXPNDR FV FOURTE 500 (Prosthesis & Implant Plastic) ×4 IMPLANT
TOWEL GREEN STERILE FF (TOWEL DISPOSABLE) ×2 IMPLANT
TRAY DSU PREP LF (CUSTOM PROCEDURE TRAY) ×2 IMPLANT
TRAY FOLEY W/BAG SLVR 14FR LF (SET/KITS/TRAYS/PACK) ×2 IMPLANT
TUBE CONNECTING 20X1/4 (TUBING) ×2 IMPLANT
UNDERPAD 30X36 HEAVY ABSORB (UNDERPADS AND DIAPERS) ×4 IMPLANT
YANKAUER SUCT BULB TIP NO VENT (SUCTIONS) ×2 IMPLANT

## 2020-06-27 NOTE — Anesthesia Postprocedure Evaluation (Signed)
Anesthesia Post Note  Patient: Sandra Macias  Procedure(s) Performed: BILATERAL RISK REDUCING NIPPLE SPARING MASTECTOMY (Bilateral Breast) BREAST RECONSTRUCTION WITH PLACEMENT OF TISSUE EXPANDER AND ALLODERM (Bilateral Breast)     Patient location during evaluation: PACU Anesthesia Type: General Level of consciousness: awake and alert Pain management: pain level controlled Vital Signs Assessment: post-procedure vital signs reviewed and stable Respiratory status: spontaneous breathing, nonlabored ventilation and respiratory function stable Cardiovascular status: blood pressure returned to baseline and stable Postop Assessment: no apparent nausea or vomiting Anesthetic complications: no   No complications documented.  Last Vitals:  Vitals:   06/27/20 1300 06/27/20 1315  BP: 113/69 108/68  Pulse: 93 89  Resp: 15 12  Temp:    SpO2: 96% 98%    Last Pain:  Vitals:   06/27/20 1315  TempSrc:   PainSc: 6                  Lynda Rainwater

## 2020-06-27 NOTE — H&P (Signed)
Subjective:     Patient ID: Sandra Macias is a 44 y.o. female.  HPI  3.5 months post op bilateral breast reduction. Presents for bilateral NSM with immediate reconstruction.  Family history sister with breast ca diagnosed age 27, BRCA+, and 2 maternal aunts breast ca. Underwent initial genetic testing over 10 years ago which was negative. Sister experienced recurrence age 76. 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 Following this underwent updated genetic testing and found to have BRCA2 mutation. Plan for risk reducing mastectomies. Plan staged approach with bilateral reductions/mastopexies followed by NSM with expander placement.  Prior DDD. 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 Pathology reduction benign.  PSH significant for abdominoplasty.  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   Sister underwent initial left mastectomy with what sounds like TRAM flap reconstruction. Currently receiving chemotherapy for right breast cancer, anticipate right mastectomy, per patient invading pectoralis muscle.      Objective:   Physical Exam CV: normal heart sounds Pulm: clear to auscultation Breasts: scars maturing soft Sn to nipple R 22 L 22 cm BW R 20 L 20 cm CW 14 cm Nipple to IMF R 8 cm to scar, 9 cm to fold L 8 cm to scar 9 cm to fold    Assessment:     BRCA2 Hx NHL S/p bilateral breast reduction    Plan:     Plan bilateral nipple sparing mastectomies with immediate expander acellular dermis reconstruction.Reviewed use IMF incisions.  Discussed use of acellular dermis in reconstruction, cadaveric source, incorporation over several weeks, risk that if has seroma or infection can act as additional nidus for infection if not incorporated.Reviewed  this is an off label use of acellular dermis.  Reviewedprepectoral vs sub pectoral reconstruction. Discussed with patient and benefit of this is no animation deformity, may be less pain. Risk may be more visible rippling over upper poles, greater need of ADM. Reviewed pre pectoral would require larger amount acellular dermis, more drains. Discussed any type reconstruction also risks long term displacement implant and visible rippling. If prepectoral counseled I would recommend she be comfortable with silicone implants as more options that have less rippling. Sheagrees to prepectoral placement.  Reviewed overnight stay, drains, dressings, and post op limitations.   Reports she did not use any of Norco prescribed following reduction. Will provide Rx at time of discharge, no additional Norco.

## 2020-06-27 NOTE — Anesthesia Procedure Notes (Signed)
Anesthesia Regional Block: Pectoralis block   Pre-Anesthetic Checklist: ,, timeout performed, Correct Patient, Correct Site, Correct Laterality, Correct Procedure, Correct Position, site marked, Risks and benefits discussed,  Surgical consent,  Pre-op evaluation,  At surgeon's request and post-op pain management  Laterality: Left and Right  Prep: chloraprep       Needles:  Injection technique: Single-shot  Needle Type: Stimiplex     Needle Length: 9cm  Needle Gauge: 21     Additional Needles:   Procedures:,,,, ultrasound used (permanent image in chart),,,,  Narrative:  Start time: 06/27/2020 7:24 AM End time: 06/27/2020 7:29 AM Injection made incrementally with aspirations every 5 mL.  Performed by: Personally  Anesthesiologist: Lynda Rainwater, MD

## 2020-06-27 NOTE — Op Note (Signed)
Operative Note   DATE OF OPERATION: 8.3.21  LOCATION: [facility]-observation  SURGICAL DIVISION: Plastic Surgery  PREOPERATIVE DIAGNOSES:  1. BRCA2  POSTOPERATIVE DIAGNOSES:  same  PROCEDURE:  1. Bilateral breast reconstruction with tissue expanders 2. Acellular dermis (Alloderm) for breast reconstruction 600 cm2  SURGEON: Irene Limbo MD MBA  ASSISTANT: none  ANESTHESIA:  General.   EBL: 850 ml for entire procedure  COMPLICATIONS: None immediate.   INDICATIONS FOR PROCEDURE:  The patient, Sandra Macias, is a 44 y.o. female born on 01-26-1976, is here for staged breast reconstruction following bilateral nipple sparing mastectomies. Patient has undergone prior breast reduction to prepare for nipple sparing mastectomies. Plan prepectoral placement expander and acellular dermis.   FINDINGS: Natrelle 133S FV-13-T 500 ml tissue expanders placed bilateral, initial fill volume 320 ml air. RIGHT SN [id number] LEFT [id number]  DESCRIPTION OF PROCEDURE:  The patient was marked with the patient in the preoperative area to mark sternal notch, chest midline, anterior axillary lines and inframammary folds.The patient's operative site was prepped and draped in a sterile fashion. A time out was performed and all information was confirmed to be correct.I assisted in mastectomies with exposure and retraction.Following completion of mastectomies, reconstruction began onrightside.  The cavity was irrigated withsaline followed by salinesolution containing Ancef, gentamicin, and bacitracin. Hemostasis was ensured.A 19 Fr drain was placed in subcutaneous position laterally and a 15 Fr drain placed along inframammary fold. Each secured to skin with 2-0 nylon. Cavity irrigated with Betadinesaline solution. The tissue expanders were prepared on back table prior in insertion. The expander was filled with air.Perforated acellular dermiswasdraped over anterior surface expander. The ADM  was then secured to itself over posterior surface of expanderwith 4-0 chromic. Redundant folds acellular dermis excised so that the ADM layflat without folds over air filled expander.The expander was secured tofascia over lateral sternal borderwith a 0 vicryl. Thelateral tab wasalso secured to pectoralis muscle with 0-vicryl. The ADM was secured to pectoralis muscle and chest wall along inferior border at inframammary foldwith 0 V-lock suture.Laterally the mastectomy flap over posterior axillary line was advanced anteriorly and the subcutaneous tissue and superficial fascia was secured to pectoralisand serratusmuscleswith 0-vicryl. Skin closure completedwith 3-0 vicryl in fascial layer and 4-0 vicryl in dermis. Skin closure completed with 4-0 monocryl subcuticular and tissue adhesive.  I then directed my attention toleftchest where similar irrigation and drain placement completed. The prepared expander with ADM secured over anterior surface was placed inleftchest and tabs secured to chest wall and pectoralis muscle with 0- vicryl suture. The acellular dermis at inframammary fold was secured to chest wall with 0 V-lock suture.Laterally the mastectomy flap over posterior axillary line was advanced anteriorly and the subcutaneous tissue and superficial fascia was secured to pectoralisand serratusmuscleswith 0-vicryl. Skin closure completedwith 3-0 vicryl in fascial layer and 4-0 vicryl in dermis. Skin closure completed with 4-0 monocryl subcuticular and tissue adhesive.Patient brought to sitting position.The mastectomy flaps were redrapedso that NAC was symmetric from sternal notch and chest midline. Patient returned to supine position.Tegaderm dressings applied followed bydry dressing,breast binder.  The patient was allowed to wake from anesthesia, extubated and taken to the recovery room in satisfactory condition.   SPECIMENS: none  DRAINS: 15 and 19 Fr JP in right and left breast  reconstruction

## 2020-06-27 NOTE — Anesthesia Preprocedure Evaluation (Addendum)
Anesthesia Evaluation  Patient identified by MRN, date of birth, ID band Patient awake    Reviewed: Allergy & Precautions, NPO status , Patient's Chart, lab work & pertinent test results  Airway Mallampati: II  TM Distance: >3 FB Neck ROM: Full    Dental no notable dental hx.    Pulmonary neg pulmonary ROS,    Pulmonary exam normal breath sounds clear to auscultation       Cardiovascular negative cardio ROS Normal cardiovascular exam Rhythm:Regular Rate:Normal     Neuro/Psych negative neurological ROS  negative psych ROS   GI/Hepatic negative GI ROS, Neg liver ROS,   Endo/Other  negative endocrine ROS  Renal/GU negative Renal ROS  negative genitourinary   Musculoskeletal negative musculoskeletal ROS (+)   Abdominal   Peds negative pediatric ROS (+)  Hematology non hodgkins lymphoma, had chemotherapy x 6 until 05-14-19   Anesthesia Other Findings Breast Cancer  Reproductive/Obstetrics negative OB ROS                             Anesthesia Physical  Anesthesia Plan  ASA: III  Anesthesia Plan: General   Post-op Pain Management:  Regional for Post-op pain   Induction: Intravenous  PONV Risk Score and Plan: 3 and Ondansetron, Dexamethasone, Midazolam and Treatment may vary due to age or medical condition  Airway Management Planned: Oral ETT  Additional Equipment:   Intra-op Plan:   Post-operative Plan: Extubation in OR  Informed Consent: I have reviewed the patients History and Physical, chart, labs and discussed the procedure including the risks, benefits and alternatives for the proposed anesthesia with the patient or authorized representative who has indicated his/her understanding and acceptance.     Dental advisory given  Plan Discussed with: CRNA and Surgeon  Anesthesia Plan Comments:         Anesthesia Quick Evaluation

## 2020-06-27 NOTE — Anesthesia Procedure Notes (Signed)
Procedure Name: Intubation Date/Time: 06/27/2020 7:45 AM Performed by: Glory Buff, CRNA Pre-anesthesia Checklist: Patient identified, Emergency Drugs available, Suction available and Patient being monitored Patient Re-evaluated:Patient Re-evaluated prior to induction Oxygen Delivery Method: Circle system utilized Preoxygenation: Pre-oxygenation with 100% oxygen Induction Type: IV induction Ventilation: Mask ventilation without difficulty Laryngoscope Size: Miller and 3 Grade View: Grade I Tube type: Oral Tube size: 7.0 mm Number of attempts: 1 Airway Equipment and Method: Stylet and Oral airway Placement Confirmation: ETT inserted through vocal cords under direct vision,  positive ETCO2 and breath sounds checked- equal and bilateral Secured at: 20 cm Tube secured with: Tape Dental Injury: Teeth and Oropharynx as per pre-operative assessment

## 2020-06-27 NOTE — Op Note (Signed)
Preoperative diagnosis: BRCA 2 mutation Postoperative diagnosis: saa Procedure: bilateral risk reducing nipple sparing mastectomies Surgeon: Dr Serita Grammes Asst: Dr Irene Limbo Anesthesia: general with bilateral pectoral blocks EBL: 600cc Drains per plastic surgery specimens 1. Right nsm short superior, long lateral, double deep 2. Left nsm short superior, long lateral, double deep 3. Right and left nipple biopsies Complications none dispo turned over to plastic surgery  Indications: 44 yof who was diagnosed with anaplastic large cell lymphoma in 2/20 and underwent AC which ended in June 2020. did not receive radiotherapy. she is now in remission and being followed every three months.  she has fh of breast cancer in a sister who was first diagnosed at age 64 and then recurred at age 67. the patient underwent genetic testing which was negative over a decade ago and then due to sister recurrence had her genetic testing redone which showed a BRCA2 mutation. she has undergone a bilateral proph SO in December. she has no prior breast issues personally. she has no mass or discharge. she has screening mm in 7/20 that shows c density breasts. there are new grouped calcs in the right uiq. this measures 4 mm. [place] was done of the axilla and that was negative. she underwent stereo biopsy with clip placement. this was fcc and adenosis.Marland Kitchen breasts are 38DDD I talked to her and we eventually talked about risk reducing surgery but nsm after reduction. she has undergone reduction mammaplasty with benign pathology. She is now ready for risk reducing bilateral nsm.  Procedure: After informed consent obtained patient first underwent bilateral pectoral blocks.  She was given ancef and SCDs were in place. She was placed under general anesthesia without complication.  She was prepped and draped in standard sterile surgical fashion. Surgical timeout performed.  I first did the left side. I made a 10 cm  inframammary incision starting 9 cm from sternum. I then created the posterior flap including the pectoralis fascia. I did this to parasternal region, clavicle and latissimus laterally.  I then created the anterior flap using cautery to the same margins. I released this from axilla and this is where all the EBL came from as there were several small vessels I controlled with cautery and suture at the apex of the dissection. The breast was removed and passed off the table and marked.  I then did the nipple biopsy separately and sent this off. I ensured there was no more breast tissue on her flaps. I obtained hemostasis and then packed this side and moved to the right.  On the right side I made a 10 cm inframammary incision starting 9 cm from sternum. I then created the posterior flap including the pectoralis fascia. I did this to parasternal region, clavicle and latissimus laterally.  I then created the anterior flap using cautery to the same margins.  The breast was removed and passed off the table and marked.  I then did the nipple biopsy separately and sent this off. I ensured there was no more breast tissue on her flaps. I obtained hemostasis and then packed this side.  The case was then turned over to plastic surgery for reconstruction

## 2020-06-27 NOTE — Transfer of Care (Signed)
Immediate Anesthesia Transfer of Care Note  Patient: Sandra Macias  Procedure(s) Performed: BILATERAL RISK REDUCING NIPPLE SPARING MASTECTOMY (Bilateral Breast) BREAST RECONSTRUCTION WITH PLACEMENT OF TISSUE EXPANDER AND ALLODERM (Bilateral Breast)  Patient Location: PACU  Anesthesia Type:General  Level of Consciousness: drowsy, patient cooperative and responds to stimulation  Airway & Oxygen Therapy: Patient Spontanous Breathing and Patient connected to face mask oxygen  Post-op Assessment: Report given to RN and Post -op Vital signs reviewed and stable  Post vital signs: Reviewed and stable  Last Vitals:  Vitals Value Taken Time  BP 109/64 06/27/20 1223  Temp    Pulse 95 06/27/20 1225  Resp 14 06/27/20 1225  SpO2 100 % 06/27/20 1225  Vitals shown include unvalidated device data.  Last Pain:  Vitals:   06/27/20 0652  TempSrc: Oral  PainSc: 0-No pain      Patients Stated Pain Goal: 5 (89/78/47 8412)  Complications: No complications documented.

## 2020-06-27 NOTE — Interval H&P Note (Signed)
History and Physical Interval Note:  06/27/2020 7:13 AM  Sandra Macias  has presented today for surgery, with the diagnosis of BRCA 2 POSITIVE.  The various methods of treatment have been discussed with the patient and family. After consideration of risks, benefits and other options for treatment, the patient has consented to  Procedure(s) with comments: [place] (Bilateral) - PEC BLOCK BREAST RECONSTRUCTION WITH PLACEMENT OF TISSUE EXPANDER AND ALLODERM (Bilateral) as a surgical intervention.  The patient's history has been reviewed, patient examined, no change in status, stable for surgery.  I have reviewed the patient's chart and labs.  Questions were answered to the patient's satisfaction.     Rolm Bookbinder

## 2020-06-27 NOTE — H&P (Signed)
Sandra Macias is an 44 y.o. female.   Chief Complaint: BRCA mutation HPI: 66 yof who works in [employer] office in [place], lives in [place]. She was diagnosed with anaplastic large cell lymphoma in 2/20 and underwent AC which ended in June 2020. did not receive radiotherapy. she is now in remission and being followed every three months.  she has fh of breast cancer in a sister who was first diagnosed at age 49 and then recurred at age 68. the patient underwent genetic testing which was negative over a decade ago and then due to sister recurrence had her genetic testing redone which showed a BRCA2 mutation. she has undergone a bilateral proph SO in December. she has no prior breast issues personally. she has no mass or discharge. she has screening mm in 7/20 that shows c density breasts. there are new grouped calcs in the right uiq. this measures 4 mm. [place] was done of the axilla and that was negative. she underwent stereo biopsy with clip placement. this was fcc and adenosis. she has an mri for which i have seen report but dont have rest of it. there is oval mass that is undergoing second look [place].  she has seen a couple others to discuss options and is interested in risk reduction surgery. she saw Dr [name] of plastic surgery. breasts are 38DDD I talked to her and we eventually talked about risk reducing surgery but nsm after reduction. she has undergone reduction mammaplasty with benign pathology.  Past Medical History:  Diagnosis Date   Cancer [facility])    non hodgkins lymphoma, had chemotherapy x 6 until 05-14-19   Constipation     Past Surgical History:  Procedure Laterality Date   ABDOMINOPLASTY     2010   BREAST REDUCTION SURGERY Bilateral 03/10/2020   Procedure: MAMMARY REDUCTION  (BREAST);  Surgeon: Irene Limbo, MD;  Location: [place];  Service: Plastics;  Laterality: Bilateral;   CESAREAN SECTION     2000 2007   DILATATION &  CURRETTAGE/HYSTEROSCOPY WITH RESECTOCOPE N/A 07/21/2015   Procedure: DILATATION & CURETTAGE/HYSTEROSCOPY WITH RESECTOCOPE;  Surgeon: Servando Salina, MD;  Location: [facility];  Service: Gynecology;  Laterality: N/A;   DILATION AND CURETTAGE OF UTERUS     port a cath insertion  01/28/2019   removed May 27, 2019   ROBOTIC ASSISTED SALPINGO OOPHERECTOMY Bilateral 11/11/2019   Procedure: XI ROBOTIC ASSISTED SALPINGO OOPHORECTOMY/With Pelvic Washings;  Surgeon: Servando Salina, MD;  Location: [place];  Service: Gynecology;  Laterality: Bilateral;  Requests 90 min.    History reviewed. No pertinent family history. Social History:  reports that she has never smoked. She has never used smokeless tobacco. She reports current alcohol use. She reports that she does not use drugs.  Allergies:  Allergies  Allergen Reactions   Shellfish Allergy Anaphylaxis    Crab throat closes    Medications Prior to Admission  Medication Sig Dispense Refill   cetirizine (ZYRTEC) 10 MG tablet Take 10 mg by mouth daily.     gabapentin (NEURONTIN) 300 MG capsule Take 300 mg by mouth 3 (three) times daily.     Cholecalciferol (VITAMIN D3 PO) Take by mouth.     EPINEPHrine 0.3 mg/0.3 mL IJ SOAJ injection Inject 0.3 mg into the muscle as needed for anaphylaxis.     Linaclotide (LINZESS PO) Take 1 tablet by mouth daily as needed (chronic constipation). 290 mcg     valACYclovir (VALTREX) 500 MG tablet Take 500 mg by mouth  2 (two) times daily as needed. Takes 4 tabs every 12 hours prn      Results for orders placed or performed during the hospital encounter of 06/27/20 (from the past 48 hour(s))  Pregnancy, urine POC     Status: None   Collection Time: 06/27/20  6:35 AM  Result Value Ref Range   Preg Test, Ur NEGATIVE NEGATIVE    Comment:        THE SENSITIVITY OF THIS METHODOLOGY IS >24 mIU/mL    No results found.  Review of Systems  All other systems reviewed and are  negative.   Blood pressure 123/88, pulse 83, temperature 98.1 F (36.7 C), temperature source Oral, resp. rate 16, height '5\' 7"'  (1.702 m), weight 91.4 kg, SpO2 100 %. Physical Exam  Physical Exam Rolm Bookbinder MD; 04/19/2020 4:13 PM) Breast Note: well healed incisions bilaterally no lymphadenopathy cv rrr Lungs clear  Assessment/Plan BRCA2 GENE MUTATION POSITIVE (Z15.01) Story: Bilateral nipple sparing mastectomies Plan is to do risk reducing nsm bilaterally (no indication for nodal surgery) 3 months after reduction mammaplasty. I discussed NSM with patient with risks of tissue loss, nipple loss, desquamation, loss of nipple sensation, cancer in nipple requiring excision. she is due to follow up with plastic surgery to discuss reconstruction at same time.   Rolm Bookbinder, MD 06/27/2020, 7:11 AM

## 2020-06-27 NOTE — Progress Notes (Signed)
Assisted Dr. Sabra Heck with right, left, ultrasound guided, pectoralis block. Side rails up, monitors on throughout procedure. See vital signs in flow sheet. Tolerated Procedure well.

## 2020-06-28 ENCOUNTER — Encounter (HOSPITAL_BASED_OUTPATIENT_CLINIC_OR_DEPARTMENT_OTHER): Payer: Self-pay | Admitting: General Surgery

## 2020-06-28 DIAGNOSIS — Z1501 Genetic susceptibility to malignant neoplasm of breast: Secondary | ICD-10-CM | POA: Diagnosis not present

## 2020-06-28 NOTE — Progress Notes (Signed)
Patient ID: Sandra Macias, female   DOB: January 18, 1976, 44 y.o.   MRN: [id number] Doing well this am, vitals fine, drains as expected, can dc home once seen by Dr Iran Planas

## 2020-06-28 NOTE — Discharge Summary (Signed)
Physician Discharge Summary  Patient ID: Sandra Macias MRN: [id number] DOB/AGE: 1976-04-30 44 y.o.  Admit date: 06/27/2020 Discharge date: 06/28/2020  Admission Diagnoses: BRCA gene positive  Discharge Diagnoses:  Active Problems:   BRCA gene positive   Discharged Condition: stable  Hospital Course: Post operatively patient did well with pain controlled with oral medication. Patient able to ambulate with minimal assist and tolerated diet. Instructed on bathing dressing and drain care.  Treatments: surgery: bilateral nipple sparing mastectomies, tissue expander acellular dermis reconstruction 8.3.21  Discharge Exam: Blood pressure 104/70, pulse 94, temperature (!) 97.1 F (36.2 C), resp. rate 16, height '5\' 7"'  (1.702 m), weight 91.4 kg, SpO2 100 %. Incision/Wound: chest soft incisions dry intact no hematoma Tegaderms in place drains serosanguinous  Disposition: Discharge disposition: 01-Home or Self Care       Discharge Instructions    Call MD for:  redness, tenderness, or signs of infection (pain, swelling, bleeding, redness, odor or green/yellow discharge around incision site)   Complete by: As directed    Call MD for:  temperature >100.5   Complete by: As directed    Discharge instructions   Complete by: As directed    Ok to remove dressings and shower am 8.5.21 Soap and water ok, pat Tegaderms dry. Do not remove Tegaderms. No creams or ointments over incisions. Do not let drains dangle in shower, attach to lanyard or similar.Strip and record drains twice daily and bring log to clinic visit.  Breast binder or soft compression bra all other times.  Ok to raise arms above shoulders for bathing and dressing.  No house yard work or exercise until cleared by MD.   Patient has pain medication at home. Recommend ibuprofen with meals to aid with pain control. Recommend Miralax or Dulcolax as needed for constipation.   Driving Restrictions   Complete by: As directed    No  driving for 2 weeks then no driving if taking narcotics   Lifting restrictions   Complete by: As directed    No lifting > 5 lbs until cleared by MD   Resume previous diet   Complete by: As directed      Allergies as of 06/28/2020      Reactions   Shellfish Allergy Anaphylaxis   Crab throat closes      Medication List    TAKE these medications   cetirizine 10 MG tablet Commonly known as: ZYRTEC Take 10 mg by mouth daily.   EPINEPHrine 0.3 mg/0.3 mL Soaj injection Commonly known as: EPI-PEN Inject 0.3 mg into the muscle as needed for anaphylaxis.   gabapentin 300 MG capsule Commonly known as: NEURONTIN Take 300 mg by mouth 3 (three) times daily.   LINZESS PO Take 1 tablet by mouth daily as needed (chronic constipation). 290 mcg   methocarbamol 500 MG tablet Commonly known as: Robaxin Take 1 tablet (500 mg total) by mouth every 8 (eight) hours as needed for muscle spasms.   sulfamethoxazole-trimethoprim 800-160 MG tablet Commonly known as: BACTRIM DS Take 1 tablet by mouth 2 (two) times daily.   valACYclovir 500 MG tablet Commonly known as: VALTREX Take 500 mg by mouth 2 (two) times daily as needed. Takes 4 tabs every 12 hours prn   VITAMIN D3 PO Take by mouth.       Follow-up Information    Rolm Bookbinder, MD In 2 weeks.   Specialty: General Surgery Contact information: [address] [place] [address] [phone]  Irene Limbo, MD In 1 week.   Specialty: Plastic Surgery Why: as scheduled Contact information: [address] [place] [address] [phone]               Signed: Irene Limbo 06/28/2020, 7:57 AM

## 2020-06-28 NOTE — Discharge Instructions (Signed)
[facility] °[phone] ° °MASTECTOMY: POST OP INSTRUCTIONS °Take 400 mg of ibuprofen every 8 hours or 650 mg tylenol every 6 hours for next 72 hours then as needed. Use ice several times daily also. °Always review your discharge instruction sheet given to you by the facility where your surgery was performed. ° °IF YOU HAVE DISABILITY OR FAMILY LEAVE FORMS, YOU MUST BRING THEM TO THE OFFICE FOR PROCESSING.   °DO NOT GIVE THEM TO YOUR DOCTOR. °A prescription for pain medication may be given to you upon discharge.  Take your pain medication as prescribed, if needed.  If narcotic pain medicine is not needed, then you may take acetaminophen (Tylenol), naprosyn (Alleve) or ibuprofen (Advil) as needed. °1. Take your usually prescribed medications unless otherwise directed. °2. If you need a refill on your pain medication, please contact your pharmacy.  They will contact our office to request authorization.  Prescriptions will not be filled after 5pm or on week-ends. °3. You should follow a light diet the first few days after arrival home, such as soup and crackers, etc.  Resume your normal diet the day after surgery. °4. Most patients will experience some swelling and bruising on the chest and underarm.  Ice packs will help.  Swelling and bruising can take several days to resolve. Wear the binder day and night until you return to the office.  °5. It is common to experience some constipation if taking pain medication after surgery.  Increasing fluid intake and taking a stool softener (such as Colace) will usually help or prevent this problem from occurring.  A mild laxative (Milk of Magnesia or Miralax) should be taken according to package instructions if there are no bowel movements after 48 hours. °6. Unless discharge instructions indicate otherwise, leave your bandage dry and in place until your next appointment in 3-5 days.  You may take a limited sponge bath.  No tube baths or showers until the  drains are removed.  You may have steri-strips (small skin tapes) in place directly over the incision.  These strips should be left on the skin for 7-10 days. If you have glue it will come off in next couple week.  Any sutures will be removed at an office visit °7. DRAINS:  If you have drains in place, it is important to keep a list of the amount of drainage produced each day in your drains.  Before leaving the hospital, you should be instructed on drain care.  Call our office if you have any questions about your drains. I will remove your drains when they put out less than 30 cc or ml for 2 consecutive days. °8. ACTIVITIES:  You may resume regular (light) daily activities beginning the next day--such as daily self-care, walking, climbing stairs--gradually increasing activities as tolerated.  You may have sexual intercourse when it is comfortable.  Refrain from any heavy lifting or straining until approved by your doctor. °a. You may drive when you are no longer taking prescription pain medication, you can comfortably wear a seatbelt, and you can safely maneuver your car and apply brakes. °b. RETURN TO WORK:  __________________________________________________________ °9. You should see your doctor in the office for a follow-up appointment approximately 3-5 days after your surgery.  Your doctor’s nurse will typically make your follow-up appointment when she calls you with your pathology report.  Expect your pathology report 3-4business days after surgery. °10. OTHER INSTRUCTIONS: ______________________________________________________________________________________________ ____________________________________________________________________________________________ °WHEN TO CALL YOUR DR WAKEFIELD: °1. Fever over 101.0 °  Nausea and/or vomiting 3. Extreme swelling or bruising 4. Continued bleeding from incision. 5. Increased pain, redness, or drainage from the incision. The clinic staff is available to answer your  questions during regular business hours.  Please don't hesitate to call and ask to speak to one of the nurses for clinical concerns.  If you have a medical emergency, go to the nearest emergency room or call 911.  A surgeon from [facility] is always on call at the hospital. [address], [address], [place], [place]  [address] ? [address] [address], [place], [place]   [address] [phone] ? [phone] ? FAX [phone] Web site: [email or web address]   About my Jackson-Pratt Bulb Drain  What is a Jackson-Pratt bulb? A Jackson-Pratt is a soft, round device used to collect drainage. It is connected to a long, thin drainage catheter, which is held in place by one or two small stiches near your surgical incision site. When the bulb is squeezed, it forms a vacuum, forcing the drainage to empty into the bulb.  Emptying the Jackson-Pratt bulb- To empty the bulb: 1. Release the plug on the top of the bulb. 2. Pour the bulb's contents into a measuring container which your nurse will provide. 3. Record the time emptied and amount of drainage. Empty the drain(s) as often as your     doctor or nurse recommends.  Date                  Time                    Amount (Drain 1)                 Amount (Drain 2)  _____________________________________________________________________  _____________________________________________________________________  _____________________________________________________________________  _____________________________________________________________________  _____________________________________________________________________  _____________________________________________________________________  _____________________________________________________________________  _____________________________________________________________________  Squeezing the Jackson-Pratt Bulb- To squeeze the bulb: 1. Make sure the plug at the top of the bulb  is open. 2. Squeeze the bulb tightly in your fist. You will hear air squeezing from the bulb. 3. Replace the plug while the bulb is squeezed. 4. Use a safety pin to attach the bulb to your clothing. This will keep the catheter from     pulling at the bulb insertion site.  When to call your doctor- Call your doctor if:  Drain site becomes red, swollen or hot.  You have a fever greater than 101 degrees F.  There is oozing at the drain site.  Drain falls out (apply a guaze bandage over the drain hole and secure it with tape).  Drainage increases daily not related to activity patterns. (You will usually have more drainage when you are active than when you are resting.)  Drainage has a bad odor.   

## 2020-06-29 LAB — SURGICAL PATHOLOGY

## 2020-08-02 ENCOUNTER — Other Ambulatory Visit: Payer: Self-pay

## 2020-08-02 ENCOUNTER — Ambulatory Visit: Payer: 59 | Attending: Plastic Surgery | Admitting: Physical Therapy

## 2020-08-02 DIAGNOSIS — M6281 Muscle weakness (generalized): Secondary | ICD-10-CM | POA: Diagnosis present

## 2020-08-02 DIAGNOSIS — M25611 Stiffness of right shoulder, not elsewhere classified: Secondary | ICD-10-CM | POA: Insufficient documentation

## 2020-08-02 DIAGNOSIS — M25612 Stiffness of left shoulder, not elsewhere classified: Secondary | ICD-10-CM | POA: Diagnosis present

## 2020-08-02 NOTE — Therapy (Signed)
[facility] [place], [place], [address] Phone: [phone]   Fax:  [phone]  Physical Therapy Evaluation  Patient Details  Name: Sandra Macias MRN: [id number] Date of Birth: [birth year] Referring Provider (PT): Dr. Iran Planas    Encounter Date: 08/02/2020   PT End of Session - 08/02/20 1644    Visit Number 1    Number of Visits 9    Date for PT Re-Evaluation 09/01/20    PT Start Time 1400    PT Stop Time 1445    PT Time Calculation (min) 45 min    Activity Tolerance Patient tolerated treatment well    Behavior During Therapy [facility] for tasks assessed/performed           Past Medical History:  Diagnosis Date  . Cancer ([place])    non hodgkins lymphoma, had chemotherapy x 6 until 05-14-19  . Constipation     Past Surgical History:  Procedure Laterality Date  . ABDOMINOPLASTY     2010  . BREAST RECONSTRUCTION WITH PLACEMENT OF TISSUE EXPANDER AND ALLODERM Bilateral 06/27/2020   Procedure: BREAST RECONSTRUCTION WITH PLACEMENT OF TISSUE EXPANDER AND ALLODERM;  Surgeon: Irene Limbo, MD;  Location: [facility];  Service: Plastics;  Laterality: Bilateral;  . BREAST REDUCTION SURGERY Bilateral 03/10/2020   Procedure: MAMMARY REDUCTION  (BREAST);  Surgeon: Irene Limbo, MD;  Location: [facility];  Service: Plastics;  Laterality: Bilateral;  . CESAREAN SECTION     2000 2007  . DILATATION & CURRETTAGE/HYSTEROSCOPY WITH RESECTOCOPE N/A 07/21/2015   Procedure: DILATATION & CURETTAGE/HYSTEROSCOPY WITH RESECTOCOPE;  Surgeon: Servando Salina, MD;  Location: [facility];  Service: Gynecology;  Laterality: N/A;  . DILATION AND CURETTAGE OF UTERUS    . NIPPLE SPARING MASTECTOMY Bilateral 06/27/2020   Procedure: BILATERAL RISK REDUCING NIPPLE SPARING MASTECTOMY;  Surgeon: Rolm Bookbinder, MD;  Location: [facility];  Service: General;  Laterality: Bilateral;  PEC BLOCK  .  port a cath insertion  01/28/2019   removed May 27, 2019  . ROBOTIC ASSISTED SALPINGO OOPHERECTOMY Bilateral 11/11/2019   Procedure: XI ROBOTIC ASSISTED SALPINGO OOPHORECTOMY/With Pelvic Washings;  Surgeon: Servando Salina, MD;  Location: [facility];  Service: Gynecology;  Laterality: Bilateral;  Requests 90 min.    There were no vitals filed for this visit.    Subjective Assessment - 08/02/20 1414    Subjective "I cannot lift my arms the way they should'    Pertinent History bilateral risk reducing nipple sparing mastectomies on 06/27/3020 with immediate expanders and no lymph nodes removed. 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 No/denies              [facility] PT Assessment - 08/02/20 0001      Assessment   Medical Diagnosis  bilateral risk reducing nipple sparing mastectomies     Referring Provider (PT) Dr. Iran Planas     Onset Date/Surgical Date 06/27/20      Precautions   Precautions Other (comment)    Precaution Comments precious chemo with CIPN       Restrictions   Weight Bearing Restrictions No      Balance Screen   Has the patient fallen in the past 6 months No    Has the patient had a decrease in activity  level because of a fear of falling?  No    Is the patient reluctant to leave their home because of a fear of falling?  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, is active     Leisure not exercising       Cognition   Overall Cognitive Status  Within Functional Limits for tasks assessed      Observation/Other Assessments   Observations right lateral chest with  visible tightness and pulling in the tissue    Other Surveys  Quick Dash    Quick DASH  52.27      Sensation   Light Touch Not tested      Coordination   Gross Motor Movements are Fluid and Coordinated No      Posture/Postural Control   Posture/Postural Control Postural limitations    Postural Limitations Rounded Shoulders;Forward head      AROM   Overall AROM  Deficits    AROM Assessment Site Shoulder    Right/Left Shoulder Right;Left    Right Shoulder Flexion 135 Degrees   with pulling    Right Shoulder ABduction 142 Degrees    Right Shoulder External Rotation 90 Degrees    Left Shoulder Flexion 135 Degrees    Left Shoulder ABduction 155 Degrees    Left Shoulder External Rotation 90 Degrees      Palpation   Palpation comment rightness in soft tissue on right lateral chest                     Quick Dash - 08/02/20 0001    Open a tight or new jar Moderate difficulty    Do heavy household chores (wash walls, wash floors) Moderate difficulty    Carry a shopping bag or briefcase Moderate difficulty    Wash your back Moderate difficulty    Use a knife to cut food No difficulty    Recreational activities in which you take some force or impact through your arm, shoulder, or hand (golf, hammering, tennis) Unable    During the past week, to what extent has your arm, shoulder or hand problem interfered with your normal social activities with family, friends, neighbors, or groups? Modererately    During the past week, to what extent has your arm, shoulder or hand problem limited your work or other regular daily activities Modererately    Arm, shoulder, or hand pain. Mild    Tingling (pins and needles) in your arm, shoulder, or hand Severe    Difficulty Sleeping Severe difficulty    DASH Score 52.27 %            Objective measurements completed on  examination: See above findings.       [facility] Adult PT Treatment/Exercise - 08/02/20 0001      Exercises   Exercises Shoulder      Shoulder Exercises: Supine   Flexion AAROM;Right;Left;5 reps   with dowel   ABduction AAROM;Right;Left;5 reps   with dowel    Other Supine Exercises scapular retraction       Shoulder Exercises: Seated   Protraction AROM;Right;Left;5 reps  PT Education - 08/02/20 1644    Education Details supine dowel exercise    Person(s) Educated Patient    Methods Explanation;Demonstration;Handout    Comprehension Verbalized understanding;Returned demonstration               PT Long Term Goals - 08/02/20 1650      PT LONG TERM GOAL #1   Title Pt will have > 150 degrees of right and left shoulder abduction so that she can return to normal activities    Time 4    Period Weeks    Status New      PT LONG TERM GOAL #2   Title Pt will independent in  a home exericse for shoulder ROM and strength    Time 4    Period Weeks    Status New      PT LONG TERM GOAL #3   Title Pt will be able to lift 15 pounds easily so that she will be able to perform her duties at work without difficulty    Time 4    Period Weeks    Status New                  Plan - 08/02/20 1644    Clinical Impression Statement Pt comes to PT after risk reducinb bilateral mastectomy. She has not had any lymph nodes removed and will not have radiation therefore she is not at risk for developing lymphedema. Seh does have significant shoulder tightness and accompanying weakness and will benefit from PT to return to normal funciton.    Personal Factors and Comorbidities Comorbidity 2    Comorbidities bilateal mastecomty.  history of chemotherapy for lymphoma with CIPN    Examination-Activity Limitations Reach Overhead;Carry    Stability/Clinical Decision Making Stable/Uncomplicated    Clinical Decision Making Low    Rehab Potential Excellent    PT Frequency  2x / week    PT Duration 4 weeks    PT Treatment/Interventions ADLs/Self Care Home Management;Moist Heat;Therapeutic activities;Therapeutic exercise;Neuromuscular re-education;Patient/family education;Manual techniques;Manual lymph drainage;Taping;Passive range of motion;Joint Manipulations    PT Next Visit Plan teach meeks exercises, progress A/PROM exercise.  manual work to tight tisseu at right lateral chest Add stregthening exercises when ROM achieved. M    PT Home Exercise Plan supine dowel exercise    Consulted and Agree with Plan of Care Patient           Patient will benefit from skilled therapeutic intervention in order to improve the following deficits and impairments:  Increased muscle spasms, Impaired perceived functional ability, Decreased range of motion, Decreased strength, Increased fascial restricitons, Impaired UE functional use, Postural dysfunction, Pain  Visit Diagnosis: Stiffness of right shoulder, not elsewhere classified - Plan: PT plan of care cert/re-cert  Stiffness of left shoulder joint - Plan: PT plan of care cert/re-cert  Muscle weakness (generalized) - Plan: PT plan of care cert/re-cert     Problem List Patient Active Problem List   Diagnosis Date Noted  . BRCA gene positive 06/27/2020   Donato Heinz. Owens Shark PT  Norwood Levo 08/02/2020, 4:53 PM  [facility] [place], [place], [address] Phone: [phone]   Fax:  [phone]  Name: Sandra Macias MRN: [id number] Date of Birth: [birth year]

## 2020-08-02 NOTE — Patient Instructions (Signed)
SHOULDER: Flexion - Supine (Cane)        Cancer Rehab [phone]    First, hold cane in both hands, keep elbows straight and reach up to ceiling and bring shoulder blades together in the back     Hold cane in both hands. Raise arms up overhead. Do not allow back to arch. Hold _5__ seconds. Do __5-10__ times; __1-2__ times a day.       SELF ASSISTED WITH OBJECT: Shoulder Abduction / Adduction - Supine    Hold cane with both hands. Move both arms from side to side, keep elbows straight.  Hold when stretch felt for __5__ seconds. Repeat __5-10__ times; __1-2__ times a day. Once this becomes easier progress to third picture bringing affected arm towards ear by staying out to side. Same hold for _5_seconds. Repeat  _5-10_ times, _1-2_ times/day.

## 2020-08-07 ENCOUNTER — Encounter: Payer: Self-pay | Admitting: Physical Therapy

## 2020-08-07 ENCOUNTER — Ambulatory Visit: Payer: 59 | Admitting: Physical Therapy

## 2020-08-07 ENCOUNTER — Other Ambulatory Visit: Payer: Self-pay

## 2020-08-07 DIAGNOSIS — M25611 Stiffness of right shoulder, not elsewhere classified: Secondary | ICD-10-CM

## 2020-08-07 DIAGNOSIS — M25612 Stiffness of left shoulder, not elsewhere classified: Secondary | ICD-10-CM

## 2020-08-07 NOTE — Therapy (Signed)
[place], [place], [address] Phone: [phone]   Fax:  [phone]  Physical Therapy Treatment  Patient Details  Name: Sandra Macias MRN: [id number] Date of Birth: January 30, 1976 Referring Provider (PT): Dr. Iran Planas    Encounter Date: 08/07/2020   PT End of Session - 08/07/20 1556    Visit Number 2    Number of Visits 9    Date for PT Re-Evaluation 09/01/20    PT Start Time 0940    PT Stop Time 1555    PT Time Calculation (min) 49 min    Activity Tolerance Patient tolerated treatment well    Behavior During Therapy [facility] for tasks assessed/performed           Past Medical History:  Diagnosis Date  . Cancer ([place])    non hodgkins lymphoma, had chemotherapy x 6 until 05-14-19  . Constipation     Past Surgical History:  Procedure Laterality Date  . ABDOMINOPLASTY     2010  . BREAST RECONSTRUCTION WITH PLACEMENT OF TISSUE EXPANDER AND ALLODERM Bilateral 06/27/2020   Procedure: BREAST RECONSTRUCTION WITH PLACEMENT OF TISSUE EXPANDER AND ALLODERM;  Surgeon: Irene Limbo, MD;  Location: [place];  Service: Plastics;  Laterality: Bilateral;  . BREAST REDUCTION SURGERY Bilateral 03/10/2020   Procedure: MAMMARY REDUCTION  (BREAST);  Surgeon: Irene Limbo, MD;  Location: [place];  Service: Plastics;  Laterality: Bilateral;  . CESAREAN SECTION     2000 2007  . DILATATION & CURRETTAGE/HYSTEROSCOPY WITH RESECTOCOPE N/A 07/21/2015   Procedure: DILATATION & CURETTAGE/HYSTEROSCOPY WITH RESECTOCOPE;  Surgeon: Servando Salina, MD;  Location: [facility];  Service: Gynecology;  Laterality: N/A;  . DILATION AND CURETTAGE OF UTERUS    . NIPPLE SPARING MASTECTOMY Bilateral 06/27/2020   Procedure: BILATERAL RISK REDUCING NIPPLE SPARING MASTECTOMY;  Surgeon: Rolm Bookbinder, MD;  Location: [place];  Service: General;  Laterality: Bilateral;  PEC BLOCK  .  port a cath insertion  01/28/2019   removed May 27, 2019  . ROBOTIC ASSISTED SALPINGO OOPHERECTOMY Bilateral 11/11/2019   Procedure: XI ROBOTIC ASSISTED SALPINGO OOPHORECTOMY/With Pelvic Washings;  Surgeon: Servando Salina, MD;  Location: [place];  Service: Gynecology;  Laterality: Bilateral;  Requests 90 min.    There were no vitals filed for this visit.   Subjective Assessment - 08/07/20 1509    Subjective I can not lift my arms all the way up.    Pertinent History bilateral risk reducing nipple sparing mastectomies on 06/27/3020 with immediate expanders and no lymph nodes removed. 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 No/denies              [facility] PT Assessment - 08/07/20 0001      AROM   Right Shoulder Flexion 152 Degrees    Right Shoulder ABduction 145 Degrees    Left Shoulder Flexion 149 Degrees    Left Shoulder ABduction 152 Degrees                         [facility] Adult PT Treatment/Exercise - 08/07/20 0001      Manual Therapy   Manual Therapy Passive ROM    Passive ROM to bilateral shoulders in direction of flexion, abduction and ER with  prolonged holds                       PT Long Term Goals - 08/02/20 1650      PT LONG TERM GOAL #1   Title Pt will have > 150 degrees of right and left shoulder abduction so that she can return to normal activities    Time 4    Period Weeks    Status New      PT LONG TERM GOAL #2   Title Pt will independent in  a home exericse for shoulder ROM and strength    Time 4    Period Weeks    Status New      PT LONG TERM GOAL #3   Title Pt will be able to lift 15 pounds easily so that she will be able to perform her duties at work without difficulty    Time 4    Period Weeks    Status New                 Plan -  08/07/20 1559    Clinical Impression Statement Began PROM to bilateral shoulders in to flexion, abduction and ER with prolonged holds in all directions. Pt acheived nearly full PROM by end of session. Remeasured AROM at end of session and it had improved since evaluation. Encouraged pt to continue stretches at home.    PT Frequency 2x / week    PT Duration 4 weeks    PT Treatment/Interventions ADLs/Self Care Home Management;Moist Heat;Therapeutic activities;Therapeutic exercise;Neuromuscular re-education;Patient/family education;Manual techniques;Manual lymph drainage;Taping;Passive range of motion;Joint Manipulations    PT Next Visit Plan teach meeks exercises, progress A/PROM exercise.  manual work to tight tisseu at right lateral chest Add stregthening exercises when ROM achieved. M    PT Home Exercise Plan supine dowel exercise    Consulted and Agree with Plan of Care Patient           Patient will benefit from skilled therapeutic intervention in order to improve the following deficits and impairments:  Increased muscle spasms, Impaired perceived functional ability, Decreased range of motion, Decreased strength, Increased fascial restricitons, Impaired UE functional use, Postural dysfunction, Pain  Visit Diagnosis: Stiffness of right shoulder, not elsewhere classified  Stiffness of left shoulder joint     Problem List Patient Active Problem List   Diagnosis Date Noted  . BRCA gene positive 06/27/2020    Allyson Sabal [facility] 08/07/2020, 4:00 PM  [place], [place], [address] Phone: [phone]   Fax:  [phone]  Name: Kiaria Quinnell MRN: [id number] Date of Birth: [birth year]  Manus Gunning, PT 08/07/20 4:00 PM

## 2020-08-09 ENCOUNTER — Ambulatory Visit: Payer: 59 | Admitting: Physical Therapy

## 2020-08-14 ENCOUNTER — Other Ambulatory Visit: Payer: Self-pay

## 2020-08-14 ENCOUNTER — Ambulatory Visit: Payer: 59

## 2020-08-14 DIAGNOSIS — M6281 Muscle weakness (generalized): Secondary | ICD-10-CM

## 2020-08-14 DIAGNOSIS — M25612 Stiffness of left shoulder, not elsewhere classified: Secondary | ICD-10-CM

## 2020-08-14 DIAGNOSIS — M25611 Stiffness of right shoulder, not elsewhere classified: Secondary | ICD-10-CM | POA: Diagnosis not present

## 2020-08-14 NOTE — Therapy (Signed)
[place], [place], [address] Phone: [phone]   Fax:  [phone]  Physical Therapy Treatment  Patient Details  Name: Sandra Macias MRN: [id number] Date of Birth: Oct 17, 1976 Referring Provider (PT): Dr. Iran Planas    Encounter Date: 08/14/2020   PT End of Session - 08/14/20 1708    Visit Number 3    Number of Visits 9    Date for PT Re-Evaluation 09/01/20    PT Start Time 1610    PT Stop Time 1704    PT Time Calculation (min) 54 min    Activity Tolerance Patient tolerated treatment well    Behavior During Therapy [facility] for tasks assessed/performed           Past Medical History:  Diagnosis Date  . Cancer ([place])    non hodgkins lymphoma, had chemotherapy x 6 until 05-14-19  . Constipation     Past Surgical History:  Procedure Laterality Date  . ABDOMINOPLASTY     2010  . BREAST RECONSTRUCTION WITH PLACEMENT OF TISSUE EXPANDER AND ALLODERM Bilateral 06/27/2020   Procedure: BREAST RECONSTRUCTION WITH PLACEMENT OF TISSUE EXPANDER AND ALLODERM;  Surgeon: Irene Limbo, MD;  Location: [place];  Service: Plastics;  Laterality: Bilateral;  . BREAST REDUCTION SURGERY Bilateral 03/10/2020   Procedure: MAMMARY REDUCTION  (BREAST);  Surgeon: Irene Limbo, MD;  Location: [place];  Service: Plastics;  Laterality: Bilateral;  . CESAREAN SECTION     2000 2007  . DILATATION & CURRETTAGE/HYSTEROSCOPY WITH RESECTOCOPE N/A 07/21/2015   Procedure: DILATATION & CURETTAGE/HYSTEROSCOPY WITH RESECTOCOPE;  Surgeon: Servando Salina, MD;  Location: [facility];  Service: Gynecology;  Laterality: N/A;  . DILATION AND CURETTAGE OF UTERUS    . NIPPLE SPARING MASTECTOMY Bilateral 06/27/2020   Procedure: BILATERAL RISK REDUCING NIPPLE SPARING MASTECTOMY;  Surgeon: Rolm Bookbinder, MD;  Location: [place];  Service: General;  Laterality: Bilateral;  PEC BLOCK  .  port a cath insertion  01/28/2019   removed May 27, 2019  . ROBOTIC ASSISTED SALPINGO OOPHERECTOMY Bilateral 11/11/2019   Procedure: XI ROBOTIC ASSISTED SALPINGO OOPHORECTOMY/With Pelvic Washings;  Surgeon: Servando Salina, MD;  Location: [place];  Service: Gynecology;  Laterality: Bilateral;  Requests 90 min.    There were no vitals filed for this visit.   Subjective Assessment - 08/14/20 1613    Subjective My ROM has been better in both my shoulders since I was here last and the HEP is helping. I don't find myself going up on my tiptoes to reach up high as much anymore.    Pertinent History bilateral risk reducing nipple sparing mastectomies on 06/27/3020 with immediate expanders and no lymph nodes removed. 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 No/denies                             [facility] Adult PT Treatment/Exercise - 08/14/20 0001      Manual Therapy   Manual Therapy Passive ROM    Passive ROM to bilateral shoulders in direction of flexion, abduction, D2 and ER with prolonged holds                  PT Education - 08/14/20 1714  Education Details Briefly demonstrated ball roll up wall for pt to try with her ball at home, also end ROM stretching in doorway    Person(s) Educated Patient    Methods Explanation;Demonstration    Comprehension Verbalized understanding               PT Long Term Goals - 08/02/20 1650      PT LONG TERM GOAL #1   Title Pt will have > 150 degrees of right and left shoulder abduction so that she can return to normal activities    Time 4    Period Weeks    Status New      PT LONG TERM GOAL #2   Title Pt will independent in  a home exericse for shoulder ROM and strength    Time 4    Period Weeks    Status New      PT LONG TERM GOAL #3    Title Pt will be able to lift 15 pounds easily so that she will be able to perform her duties at work without difficulty    Time 4    Period Weeks    Status New                 Plan - 08/14/20 1710    Clinical Impression Statement Continued with P/ROM of Rt>Lt shoulders. Pt reports noticing some darkness over Rt breast that almost looks like bruising and some drawing in of tissue at lateral aspect of breast/trunk. Explained to her that when expanders get filled this can cause some pulling to tissue that they can fix during implant exchange surgery. Pt had already updated Dr. Iran Planas who said she could come in if she wanted to to have it checked so she has an appt Wednesday after next PT visit. Briefly began instructing her at end of session to incorporate stretches into her work day and ADLs to promote end ROM stretching. Also demonstrated this for her with the ball on wall as she reports to having a therapy ball at home.    Personal Factors and Comorbidities Comorbidity 2    Comorbidities bilateal mastecomty.  history of chemotherapy for lymphoma with CIPN    Examination-Activity Limitations Reach Overhead;Carry    Stability/Clinical Decision Making Stable/Uncomplicated    Rehab Potential Excellent    PT Frequency 2x / week    PT Duration 4 weeks    PT Treatment/Interventions ADLs/Self Care Home Management;Moist Heat;Therapeutic activities;Therapeutic exercise;Neuromuscular re-education;Patient/family education;Manual techniques;Manual lymph drainage;Taping;Passive range of motion;Joint Manipulations    PT Next Visit Plan Add pulleys and ball roll up wall, try supine scapular series with yellow theraband?  progress A/PROM exercise.  manual work to tight tissue at right lateral chest Add stregthening exercises when ROM achieved.    PT Home Exercise Plan supine dowel exercise; ball roll up wall    Consulted and Agree with Plan of Care Patient           Patient will benefit from  skilled therapeutic intervention in order to improve the following deficits and impairments:  Increased muscle spasms, Impaired perceived functional ability, Decreased range of motion, Decreased strength, Increased fascial restricitons, Impaired UE functional use, Postural dysfunction, Pain  Visit Diagnosis: Stiffness of right shoulder, not elsewhere classified  Stiffness of left shoulder joint  Muscle weakness (generalized)     Problem List Patient Active Problem List   Diagnosis Date Noted  . BRCA gene positive 06/27/2020    Otelia Limes, PTA 08/14/2020, 5:15 PM  [place], [place], [address] Phone: [phone]   Fax:  [phone]  Name: Sandra Macias MRN: [id number] Date of Birth: 09-12-1976

## 2020-08-16 ENCOUNTER — Ambulatory Visit: Payer: 59 | Admitting: Rehabilitation

## 2020-08-17 ENCOUNTER — Ambulatory Visit: Payer: 59

## 2020-08-17 ENCOUNTER — Ambulatory Visit: Payer: 59 | Admitting: Physical Therapy

## 2020-08-17 ENCOUNTER — Other Ambulatory Visit: Payer: Self-pay

## 2020-08-17 DIAGNOSIS — M6281 Muscle weakness (generalized): Secondary | ICD-10-CM

## 2020-08-17 DIAGNOSIS — M25611 Stiffness of right shoulder, not elsewhere classified: Secondary | ICD-10-CM | POA: Diagnosis not present

## 2020-08-17 DIAGNOSIS — M25612 Stiffness of left shoulder, not elsewhere classified: Secondary | ICD-10-CM

## 2020-08-17 NOTE — Therapy (Signed)
[place], [place], [address] Phone: [phone]   Fax:  [phone]  Physical Therapy Treatment  Patient Details  Name: Sandra Macias MRN: [id number] Date of Birth: Nov 07, 1976 Referring Provider (PT): Dr. Iran Planas    Encounter Date: 08/17/2020   PT End of Session - 08/17/20 0903    Visit Number 4    Number of Visits 9    Date for PT Re-Evaluation 09/01/20    PT Start Time 0810    PT Stop Time 0903    PT Time Calculation (min) 53 min    Activity Tolerance Patient tolerated treatment well    Behavior During Therapy [facility] for tasks assessed/performed           Past Medical History:  Diagnosis Date  . Cancer ([place])    non hodgkins lymphoma, had chemotherapy x 6 until 05-14-19  . Constipation     Past Surgical History:  Procedure Laterality Date  . ABDOMINOPLASTY     2010  . BREAST RECONSTRUCTION WITH PLACEMENT OF TISSUE EXPANDER AND ALLODERM Bilateral 06/27/2020   Procedure: BREAST RECONSTRUCTION WITH PLACEMENT OF TISSUE EXPANDER AND ALLODERM;  Surgeon: Irene Limbo, MD;  Location: [place];  Service: Plastics;  Laterality: Bilateral;  . BREAST REDUCTION SURGERY Bilateral 03/10/2020   Procedure: MAMMARY REDUCTION  (BREAST);  Surgeon: Irene Limbo, MD;  Location: [place];  Service: Plastics;  Laterality: Bilateral;  . CESAREAN SECTION     2000 2007  . DILATATION & CURRETTAGE/HYSTEROSCOPY WITH RESECTOCOPE N/A 07/21/2015   Procedure: DILATATION & CURETTAGE/HYSTEROSCOPY WITH RESECTOCOPE;  Surgeon: Servando Salina, MD;  Location: [facility];  Service: Gynecology;  Laterality: N/A;  . DILATION AND CURETTAGE OF UTERUS    . NIPPLE SPARING MASTECTOMY Bilateral 06/27/2020   Procedure: BILATERAL RISK REDUCING NIPPLE SPARING MASTECTOMY;  Surgeon: Rolm Bookbinder, MD;  Location: [place];  Service: General;  Laterality: Bilateral;  PEC BLOCK  .  port a cath insertion  01/28/2019   removed May 27, 2019  . ROBOTIC ASSISTED SALPINGO OOPHERECTOMY Bilateral 11/11/2019   Procedure: XI ROBOTIC ASSISTED SALPINGO OOPHORECTOMY/With Pelvic Washings;  Surgeon: Servando Salina, MD;  Location: [place];  Service: Gynecology;  Laterality: Bilateral;  Requests 90 min.    There were no vitals filed for this visit.   Subjective Assessment - 08/17/20 0814    Subjective The new tightness at the top of my breast was a little better since I was here last. And I saw Dr. Iran Planas yesterday about it she didn't know what to say except that maybe my body is rejecting the cadaver tissue she used to create the pocket for the expander since she put it on top of my pectoralis so she moved my sugery up to Oct. 12.    Pertinent History bilateral risk reducing nipple sparing mastectomies on 06/27/3020 with immediate expanders and no lymph nodes removed. 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 No/denies                             [facility] Adult PT Treatment/Exercise - 08/17/20 0001      Shoulder Exercises: Pulleys   Flexion 2 minutes    Flexion Limitations Pt returned therapist demo  ABduction 2 minutes    ABduction Limitations PT returned therapist demo      Manual Therapy   Manual Therapy Passive ROM    Passive ROM to Rt shoulder in direction of flexion, abduction, D2 and ER with prolonged holds but gently assuring no increased pull due to bruising at site of alloderm                       PT Long Term Goals - 08/02/20 1650      PT LONG TERM GOAL #1   Title Pt will have > 150 degrees of right and left shoulder abduction so that she can return to normal activities    Time 4    Period Weeks    Status New      PT LONG TERM GOAL #2   Title Pt  will independent in  a home exericse for shoulder ROM and strength    Time 4    Period Weeks    Status New      PT LONG TERM GOAL #3   Title Pt will be able to lift 15 pounds easily so that she will be able to perform her duties at work without difficulty    Time 4    Period Weeks    Status New                 Plan - 08/17/20 1413    Clinical Impression Statement Pt saw Dr. Iran Planas yesterday who told pt that her body may be not accepting alloderm skin graft used during her breast reconstruction. So inboxed Dr. Iran Planas and heard back from her after session ended asking if any restrictions with this change. She responded not sure if her body isn't "incorporating" alloderm but did move surgery up 1 month sooner, no restrictions at this time and to hold on PT x4 weeks after surgery. So will continue PT as planned until surgery on 09/05/20. Pt tolerted P/ROM of Rt shoulder well today with some increase noted by end of session but she continues to be very tender to touch at superior/medial aspect of Rt breast. Also added pulleys which she did well with. Pt will benefit from continued physical therapy at this time to improve end ROM as much as possible before surgery to allow for better ease with recovery.    Personal Factors and Comorbidities Comorbidity 2    Comorbidities bilateal mastecomty.  history of chemotherapy for lymphoma with CIPN    Examination-Activity Limitations Reach Overhead;Carry    Stability/Clinical Decision Making Stable/Uncomplicated    Rehab Potential Excellent    PT Frequency 2x / week    PT Duration 4 weeks    PT Treatment/Interventions ADLs/Self Care Home Management;Moist Heat;Therapeutic activities;Therapeutic exercise;Neuromuscular re-education;Patient/family education;Manual techniques;Manual lymph drainage;Taping;Passive range of motion;Joint Manipulations    PT Next Visit Plan Cont pulleys and add ball roll up wall, try supine scapular series with yellow  theraband since Dr. Iran Planas said no restrictions;  progress A/PROM exercise.    PT Home Exercise Plan supine dowel exercise; ball roll up wall    Consulted and Agree with Plan of Care Patient           Patient will benefit from skilled therapeutic intervention in order to improve the following deficits and impairments:  Increased muscle spasms, Impaired perceived functional ability, Decreased range of motion, Decreased strength, Increased fascial restricitons, Impaired UE functional use, Postural dysfunction, Pain  Visit Diagnosis: Stiffness of right shoulder, not elsewhere classified  Stiffness of left shoulder joint  Muscle weakness (generalized)     Problem List Patient Active Problem List   Diagnosis Date Noted  . BRCA gene positive 06/27/2020    Otelia Limes, PTA 08/17/2020, 2:19 PM  [place], [place], [address] Phone: [phone]   Fax:  [phone]  Name: Natavia Sublette MRN: [id number] Date of Birth: [birth year]

## 2020-08-21 NOTE — H&P (Signed)
Patient ID: Sandra Macias is a 44 y.o. female.  HPI  2 months post op bilateral mastectomies with expander based reconstruction. Scheduled for implant exchange and lipofilling  Family history sister with breast ca diagnosed age 13, BRCA+, and 2 maternal aunts breast ca. Underwent initial genetic testing over 10 years ago which was negative. Sister experienced recurrence age 57. 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 Following this underwent updated genetic testing and found to have BRCA2 mutation. Plan for risk reducing mastectomies. Plan staged approach with bilateral reductions/mastopexies followed by NSM with expander placement.  Prior DDD. 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 Pathology reduction benign. 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  PSH significant for abdominoplasty.  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   Sister underwent initial left mastectomy with what sounds like TRAM flap reconstruction. Currently receiving chemotherapy for right breast cancer, anticipate right mastectomy, per patient invading pectoralis muscle.     Objective:   Physical Exam  CV: normal heart sounds Pulm: clear to auscultation Breasts: right reconstruction firmer than left without cellulitis no gross seroma appreciate area of concern right upper medial chest with darker color 3 areas SN to nipple R 22 L 22 cm Nipple to IMF R 7 L 7 cm Abd prior abdominoplasty scars and lap ports scars upper abdomen. Bothered by fullness lateral abdomen in area that transitions to hips and fullness upper abdomen.      Assessment:     BRCA2 Hx NHL S/p bilateral breast reduction S/p bilateral NSM, prepectoral  TE/ADM (Alloderm) reconstruction    Plan:     Plan removal bilateral TE placement smooth round silicone implants and lipofilling from abdomen to bilateral chest.    Reviewed purpose of lipofilling to aid with contour limit visible rippling. Reviewed donor site pain need for compression variable take of graft fat necrosis that presents as masses. Desires to proceed. Plan abdomen donor site. Counseled against use of buttocks as she desires contouring in this area to limit surgical time.   As in prepectoral position recommend silicone implants, HCG or capacity filled implants. Reviewed saline vs silicone, shaped vs round.HCG or capacity filled silicone implants may offer reduced risk visible rippling. Reviewed MRI or USsurveillance for rupture with silicone implants. Reviewed examples for 4th generation, capacity filled 4th generation, and HCG implants vs saline implants. Reviewed risks AP flipping that may be more noticeable with 5th generation implants, may require surgery to correct.Reviewed textured vs smooth, former associated with ALCL risk.Reviewed size in part guided by width chest, cannot assure her cup size. Plan silicone smooth round.  Completed ASPS consent for implant placement including physician patient checklist today. She has Norco at home, will check her Robaxin and I will prescribe antibiotics for after surgery use on day of surgery.  Natrelle 133S FV-13-T 500 ml tissue expanders placed bilateral,  fill volume 450 ml saline bilateral.

## 2020-08-22 ENCOUNTER — Other Ambulatory Visit: Payer: Self-pay

## 2020-08-22 ENCOUNTER — Ambulatory Visit: Payer: 59 | Admitting: Rehabilitation

## 2020-08-22 ENCOUNTER — Encounter: Payer: Self-pay | Admitting: Rehabilitation

## 2020-08-22 DIAGNOSIS — M25611 Stiffness of right shoulder, not elsewhere classified: Secondary | ICD-10-CM | POA: Diagnosis not present

## 2020-08-22 DIAGNOSIS — M25612 Stiffness of left shoulder, not elsewhere classified: Secondary | ICD-10-CM

## 2020-08-22 DIAGNOSIS — M6281 Muscle weakness (generalized): Secondary | ICD-10-CM

## 2020-08-22 NOTE — Therapy (Signed)
[place], [place], [address] Phone: [phone]   Fax:  [phone]  Physical Therapy Treatment  Patient Details  Name: Sandra Macias MRN: [id number] Date of Birth: 10-Jun-1976 Referring Provider (PT): Dr. Iran Planas    Encounter Date: 08/22/2020   PT End of Session - 08/22/20 0848    Visit Number 5    Number of Visits 9    Date for PT Re-Evaluation 09/01/20    PT Start Time 0807    PT Stop Time 0848    PT Time Calculation (min) 41 min    Activity Tolerance Patient tolerated treatment well    Behavior During Therapy [facility] for tasks assessed/performed           Past Medical History:  Diagnosis Date  . Cancer ([place])    non hodgkins lymphoma, had chemotherapy x 6 until 05-14-19  . Constipation     Past Surgical History:  Procedure Laterality Date  . ABDOMINOPLASTY     2010  . BREAST RECONSTRUCTION WITH PLACEMENT OF TISSUE EXPANDER AND ALLODERM Bilateral 06/27/2020   Procedure: BREAST RECONSTRUCTION WITH PLACEMENT OF TISSUE EXPANDER AND ALLODERM;  Surgeon: Irene Limbo, MD;  Location: [place];  Service: Plastics;  Laterality: Bilateral;  . BREAST REDUCTION SURGERY Bilateral 03/10/2020   Procedure: MAMMARY REDUCTION  (BREAST);  Surgeon: Irene Limbo, MD;  Location: [place];  Service: Plastics;  Laterality: Bilateral;  . CESAREAN SECTION     2000 2007  . DILATATION & CURRETTAGE/HYSTEROSCOPY WITH RESECTOCOPE N/A 07/21/2015   Procedure: DILATATION & CURETTAGE/HYSTEROSCOPY WITH RESECTOCOPE;  Surgeon: Servando Salina, MD;  Location: [facility];  Service: Gynecology;  Laterality: N/A;  . DILATION AND CURETTAGE OF UTERUS    . NIPPLE SPARING MASTECTOMY Bilateral 06/27/2020   Procedure: BILATERAL RISK REDUCING NIPPLE SPARING MASTECTOMY;  Surgeon: Rolm Bookbinder, MD;  Location: [place];  Service: General;  Laterality: Bilateral;  PEC BLOCK  .  port a cath insertion  01/28/2019   removed May 27, 2019  . ROBOTIC ASSISTED SALPINGO OOPHERECTOMY Bilateral 11/11/2019   Procedure: XI ROBOTIC ASSISTED SALPINGO OOPHORECTOMY/With Pelvic Washings;  Surgeon: Servando Salina, MD;  Location: [place];  Service: Gynecology;  Laterality: Bilateral;  Requests 90 min.    There were no vitals filed for this visit.   Subjective Assessment - 08/22/20 0813    Subjective Just tightness.  She said to let her know if it is worse and it may be    Pertinent History bilateral risk reducing nipple sparing mastectomies on 06/27/3020 with immediate expanders and no lymph nodes removed. 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 No/denies                             [facility] Adult PT Treatment/Exercise - 08/22/20 0001      Shoulder Exercises: Seated   Row Both;10 reps    Theraband Level (Shoulder Row) Level 1 (Yellow)    External Rotation 10 reps;Both    Theraband Level (Shoulder External Rotation) Level 1 (Yellow)      Shoulder Exercises: Pulleys   Flexion 2 minutes    ABduction 2 minutes      Shoulder Exercises: Therapy Ball   Flexion Both;5 reps    Flexion Limitations with initial cueing  Manual Therapy   Manual Therapy Edema management    Passive ROM to bil shoulder in direction of flexion, abduction, D2 and ER with prolonged holds                         PT Long Term Goals - 08/02/20 1650      PT LONG TERM GOAL #1   Title Pt will have > 150 degrees of right and left shoulder abduction so that she can return to normal activities    Time 4    Period Weeks    Status New      PT LONG TERM GOAL #2   Title Pt will independent in  a home exericse for shoulder ROM and strength    Time 4    Period Weeks    Status New      PT LONG TERM GOAL #3   Title Pt will be able to lift 15 pounds  easily so that she will be able to perform her duties at work without difficulty    Time [place] - 08/22/20 9810    Clinical Impression Statement Return note from Dr. Iran Planas stating that the patient does not have any restrictions with PT prior to surgery but will not be allowed PT x 4 weeks post surgery.  Pt has some darkening of the bruising at the Rt superior breast so note was sent to Dr. Iran Planas to check.  Continues with Rt breast and chest tightness but tolerates all well without pain    PT Frequency 2x / week    PT Duration 4 weeks    PT Treatment/Interventions ADLs/Self Care Home Management;Moist Heat;Therapeutic activities;Therapeutic exercise;Neuromuscular re-education;Patient/family education;Manual techniques;Manual lymph drainage;Taping;Passive range of motion;Joint Manipulations    PT Next Visit Plan Cont pulleys and add ball roll up wall, try supine scapular series with yellow theraband since Dr. Iran Planas said no restrictions;  progress A/PROM exercise.    Consulted and Agree with Plan of Care Patient           Patient will benefit from skilled therapeutic intervention in order to improve the following deficits and impairments:  Increased muscle spasms, Impaired perceived functional ability, Decreased range of motion, Decreased strength, Increased fascial restricitons, Impaired UE functional use, Postural dysfunction, Pain  Visit Diagnosis: Stiffness of right shoulder, not elsewhere classified  Stiffness of left shoulder joint  Muscle weakness (generalized)     Problem List Patient Active Problem List   Diagnosis Date Noted  . BRCA gene positive 06/27/2020    Sandra Macias 08/22/2020, 8:49 AM  [place], [place], [address] Phone: [phone]   Fax:  [phone]  Name: Sandra Macias MRN: [id number] Date of Birth:  1975/12/25

## 2020-08-29 ENCOUNTER — Encounter (HOSPITAL_BASED_OUTPATIENT_CLINIC_OR_DEPARTMENT_OTHER): Payer: Self-pay | Admitting: Plastic Surgery

## 2020-08-29 ENCOUNTER — Other Ambulatory Visit: Payer: Self-pay

## 2020-08-29 ENCOUNTER — Encounter: Payer: 59 | Admitting: Physical Therapy

## 2020-08-31 ENCOUNTER — Ambulatory Visit: Payer: 59 | Attending: Plastic Surgery | Admitting: Physical Therapy

## 2020-08-31 ENCOUNTER — Encounter: Payer: Self-pay | Admitting: Physical Therapy

## 2020-08-31 ENCOUNTER — Other Ambulatory Visit: Payer: Self-pay

## 2020-08-31 DIAGNOSIS — M6281 Muscle weakness (generalized): Secondary | ICD-10-CM

## 2020-08-31 DIAGNOSIS — M25612 Stiffness of left shoulder, not elsewhere classified: Secondary | ICD-10-CM | POA: Diagnosis present

## 2020-08-31 DIAGNOSIS — M25611 Stiffness of right shoulder, not elsewhere classified: Secondary | ICD-10-CM | POA: Insufficient documentation

## 2020-08-31 NOTE — Therapy (Signed)
[place], [place], [address] Phone: [phone]   Fax:  [phone]  Physical Therapy Treatment  Patient Details  Name: Sandra Macias MRN: [id number] Date of Birth: 1976/05/20 Referring Provider (PT): Dr. Iran Planas    Encounter Date: 08/31/2020   PT End of Session - 08/31/20 1750    Visit Number 6    Number of Visits 9    Date for PT Re-Evaluation 09/01/20    PT Start Time 9784    PT Stop Time 1650    PT Time Calculation (min) 45 min    Activity Tolerance Patient tolerated treatment well    Behavior During Therapy [facility] for tasks assessed/performed           Past Medical History:  Diagnosis Date  . Cancer ([place])    non hodgkins lymphoma, had chemotherapy x 6 until 05-14-19  . Constipation     Past Surgical History:  Procedure Laterality Date  . ABDOMINOPLASTY     2010  . BREAST RECONSTRUCTION WITH PLACEMENT OF TISSUE EXPANDER AND ALLODERM Bilateral 06/27/2020   Procedure: BREAST RECONSTRUCTION WITH PLACEMENT OF TISSUE EXPANDER AND ALLODERM;  Surgeon: Irene Limbo, MD;  Location: [place];  Service: Plastics;  Laterality: Bilateral;  . BREAST REDUCTION SURGERY Bilateral 03/10/2020   Procedure: MAMMARY REDUCTION  (BREAST);  Surgeon: Irene Limbo, MD;  Location: [place];  Service: Plastics;  Laterality: Bilateral;  . CESAREAN SECTION     2000 2007  . DILATATION & CURRETTAGE/HYSTEROSCOPY WITH RESECTOCOPE N/A 07/21/2015   Procedure: DILATATION & CURETTAGE/HYSTEROSCOPY WITH RESECTOCOPE;  Surgeon: Servando Salina, MD;  Location: [facility];  Service: Gynecology;  Laterality: N/A;  . DILATION AND CURETTAGE OF UTERUS    . NIPPLE SPARING MASTECTOMY Bilateral 06/27/2020   Procedure: BILATERAL RISK REDUCING NIPPLE SPARING MASTECTOMY;  Surgeon: Rolm Bookbinder, MD;  Location: [place];  Service: General;  Laterality: Bilateral;  PEC BLOCK  .  port a cath insertion  01/28/2019   removed May 27, 2019  . ROBOTIC ASSISTED SALPINGO OOPHERECTOMY Bilateral 11/11/2019   Procedure: XI ROBOTIC ASSISTED SALPINGO OOPHORECTOMY/With Pelvic Washings;  Surgeon: Servando Salina, MD;  Location: [place];  Service: Gynecology;  Laterality: Bilateral;  Requests 90 min.    There were no vitals filed for this visit.   Subjective Assessment - 08/31/20 1747    Subjective Pt is having surgery next week to remove her expander and put in implants and do fat graft  She is still having lots of pain an bruising in her right  chest  Her right chest is extremely tight and tender with muscular pulling around expander    Pertinent History bilateral risk reducing nipple sparing mastectomies on 06/27/3020 with immediate expanders and no lymph nodes removed. 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 Yes    Pain Score 8     Pain Location Breast    Pain Orientation Right    Pain Descriptors / Indicators Tightness    Pain Type Acute pain              [facility] PT Assessment - 08/31/20 0001      AROM   Right Shoulder Flexion 139 Degrees    Right Shoulder ABduction 104 Degrees  [place] Adult PT Treatment/Exercise - 08/31/20 0001      Manual Therapy   Manual Therapy Soft tissue mobilization;Manual Lymphatic Drainage (MLD)    Soft tissue mobilization with coconut oil to right pec major area  and posterior shoulder in supine and sidelying     Manual Lymphatic Drainage (MLD) gently to right chest, abdomen and axilla                        PT Long Term Goals - 08/31/20 1753      PT LONG TERM GOAL #1   Title Pt will have > 150 degrees of right and left shoulder abduction so that she can return to normal activities    Time 4    Period  Weeks    Status Partially Met      PT LONG TERM GOAL #2   Title Pt will independent in  a home exericse for shoulder ROM and strength    Status Achieved      PT LONG TERM GOAL #3   Title Pt will be able to lift 15 pounds easily so that she will be able to perform her duties at work without difficulty    Status Not Met                 Plan - 08/31/20 1751    Clinical Impression Statement Pt with tender tightness in right chest with decreased active shoulder ROM due to tightness.  She felt symptomatic relief with soft tissue work today and feels like she might be able to sleep tonight    Personal Factors and Comorbidities Comorbidity 2    Comorbidities bilateal mastecomty.  history of chemotherapy for lymphoma with CIPN    Examination-Activity Limitations Reach Overhead;Carry    Stability/Clinical Decision Making Stable/Uncomplicated    Rehab Potential Excellent    PT Frequency 2x / week    PT Duration 4 weeks    PT Treatment/Interventions ADLs/Self Care Home Management;Moist Heat;Therapeutic activities;Therapeutic exercise;Neuromuscular re-education;Patient/family education;Manual techniques;Manual lymph drainage;Taping;Passive range of motion;Joint Manipulations    PT Next Visit Plan Focus on soft tissue work and relaxation for pain control if she comes back before surgery. If not do post op reassessment and renew    Consulted and Agree with Plan of Care Patient           Patient will benefit from skilled therapeutic intervention in order to improve the following deficits and impairments:  Increased muscle spasms, Impaired perceived functional ability, Decreased range of motion, Decreased strength, Increased fascial restricitons, Impaired UE functional use, Postural dysfunction, Pain  Visit Diagnosis: Stiffness of right shoulder, not elsewhere classified  Stiffness of left shoulder joint  Muscle weakness (generalized)     Problem List Patient Active Problem List    Diagnosis Date Noted  . BRCA gene positive 06/27/2020   Donato Heinz. Owens Shark PT  Norwood Levo 08/31/2020, 5:54 PM  [place], [place], [address] Phone: [phone]   Fax:  [phone]  Name: Sandra Macias MRN: [id number] Date of Birth: July 06, 1976

## 2020-09-01 ENCOUNTER — Inpatient Hospital Stay (HOSPITAL_COMMUNITY): Admission: RE | Admit: 2020-09-01 | Payer: 59 | Source: Ambulatory Visit

## 2020-09-02 ENCOUNTER — Other Ambulatory Visit (HOSPITAL_COMMUNITY)
Admission: RE | Admit: 2020-09-02 | Discharge: 2020-09-02 | Disposition: A | Discharge status: Home / Self Care | Payer: 59 | Source: Ambulatory Visit | Attending: Plastic Surgery | Admitting: Plastic Surgery

## 2020-09-02 DIAGNOSIS — Z20822 Contact with and (suspected) exposure to covid-19: Secondary | ICD-10-CM | POA: Insufficient documentation

## 2020-09-02 DIAGNOSIS — Z01812 Encounter for preprocedural laboratory examination: Secondary | ICD-10-CM | POA: Diagnosis present

## 2020-09-02 LAB — SARS CORONAVIRUS 2 (TAT 6-24 HRS): SARS Coronavirus 2: NEGATIVE

## 2020-09-05 ENCOUNTER — Encounter (HOSPITAL_BASED_OUTPATIENT_CLINIC_OR_DEPARTMENT_OTHER)
Admission: RE | Disposition: A | Discharge status: Home / Self Care | Payer: Self-pay | Source: Home / Self Care | Attending: Plastic Surgery

## 2020-09-05 ENCOUNTER — Ambulatory Visit (HOSPITAL_BASED_OUTPATIENT_CLINIC_OR_DEPARTMENT_OTHER): Payer: 59 | Admitting: Anesthesiology

## 2020-09-05 ENCOUNTER — Other Ambulatory Visit: Payer: Self-pay

## 2020-09-05 ENCOUNTER — Ambulatory Visit (HOSPITAL_BASED_OUTPATIENT_CLINIC_OR_DEPARTMENT_OTHER)
Admission: RE | Admit: 2020-09-05 | Discharge: 2020-09-05 | Disposition: A | Discharge status: Home / Self Care | Payer: 59 | Attending: Plastic Surgery | Admitting: Plastic Surgery

## 2020-09-05 ENCOUNTER — Encounter (HOSPITAL_BASED_OUTPATIENT_CLINIC_OR_DEPARTMENT_OTHER): Payer: Self-pay | Admitting: Plastic Surgery

## 2020-09-05 DIAGNOSIS — Z803 Family history of malignant neoplasm of breast: Secondary | ICD-10-CM | POA: Diagnosis not present

## 2020-09-05 DIAGNOSIS — Z9013 Acquired absence of bilateral breasts and nipples: Secondary | ICD-10-CM | POA: Diagnosis not present

## 2020-09-05 DIAGNOSIS — Z421 Encounter for breast reconstruction following mastectomy: Secondary | ICD-10-CM | POA: Insufficient documentation

## 2020-09-05 DIAGNOSIS — Z853 Personal history of malignant neoplasm of breast: Secondary | ICD-10-CM | POA: Insufficient documentation

## 2020-09-05 DIAGNOSIS — Z9221 Personal history of antineoplastic chemotherapy: Secondary | ICD-10-CM | POA: Insufficient documentation

## 2020-09-05 HISTORY — PX: LIPOSUCTION WITH LIPOFILLING: SHX6436

## 2020-09-05 HISTORY — PX: REMOVAL OF BILATERAL TISSUE EXPANDERS WITH PLACEMENT OF BILATERAL BREAST IMPLANTS: SHX6431

## 2020-09-05 SURGERY — REMOVAL, TISSUE EXPANDER, BREAST, BILATERAL, WITH BILATERAL IMPLANT IMPLANT INSERTION
Anesthesia: General | Site: Breast | Laterality: Bilateral

## 2020-09-05 MED ORDER — ONDANSETRON HCL 4 MG/2ML IJ SOLN
INTRAMUSCULAR | Status: AC
  Filled 2020-09-05: qty 2

## 2020-09-05 MED ORDER — DIPHENHYDRAMINE HCL 50 MG/ML IJ SOLN
INTRAMUSCULAR | Status: DC | PRN
  Administered 2020-09-05: 6.25 mg via INTRAVENOUS

## 2020-09-05 MED ORDER — 0.9 % SODIUM CHLORIDE (POUR BTL) OPTIME
TOPICAL | Status: DC | PRN
  Administered 2020-09-05: 600 mL

## 2020-09-05 MED ORDER — GLYCOPYRROLATE PF 0.2 MG/ML IJ SOSY
PREFILLED_SYRINGE | INTRAMUSCULAR | Status: DC | PRN
  Administered 2020-09-05: .2 mg via INTRAVENOUS

## 2020-09-05 MED ORDER — PROPOFOL 10 MG/ML IV BOLUS
INTRAVENOUS | Status: AC
  Filled 2020-09-05: qty 20

## 2020-09-05 MED ORDER — CEFAZOLIN SODIUM-DEXTROSE 2-4 GM/100ML-% IV SOLN
2.0000 g | INTRAVENOUS | Status: AC
  Administered 2020-09-05: 2 g via INTRAVENOUS

## 2020-09-05 MED ORDER — GLYCOPYRROLATE PF 0.2 MG/ML IJ SOSY
PREFILLED_SYRINGE | INTRAMUSCULAR | Status: AC
  Filled 2020-09-05: qty 1

## 2020-09-05 MED ORDER — EPHEDRINE 5 MG/ML INJ
INTRAVENOUS | Status: AC
  Filled 2020-09-05: qty 10

## 2020-09-05 MED ORDER — SODIUM BICARBONATE 4.2 % IV SOLN
INTRAVENOUS | Status: AC
  Filled 2020-09-05: qty 20

## 2020-09-05 MED ORDER — SULFAMETHOXAZOLE-TRIMETHOPRIM 800-160 MG PO TABS: 1.0000 | ORAL_TABLET | Freq: Two times a day (BID) | ORAL | 0 refills | Status: DC

## 2020-09-05 MED ORDER — DEXAMETHASONE SODIUM PHOSPHATE 10 MG/ML IJ SOLN
INTRAMUSCULAR | Status: AC
  Filled 2020-09-05: qty 1

## 2020-09-05 MED ORDER — OXYCODONE HCL 5 MG PO TABS
5.0000 mg | ORAL_TABLET | Freq: Once | ORAL | Status: AC | PRN
  Administered 2020-09-05: 5 mg via ORAL

## 2020-09-05 MED ORDER — CELECOXIB 200 MG PO CAPS
200.0000 mg | ORAL_CAPSULE | ORAL | Status: AC
  Administered 2020-09-05: 200 mg via ORAL

## 2020-09-05 MED ORDER — LIDOCAINE 2% (20 MG/ML) 5 ML SYRINGE
INTRAMUSCULAR | Status: DC | PRN
  Administered 2020-09-05: 20 mg via INTRAVENOUS

## 2020-09-05 MED ORDER — CHLORHEXIDINE GLUCONATE CLOTH 2 % EX PADS: 6.0000 | MEDICATED_PAD | Freq: Once | CUTANEOUS | Status: DC

## 2020-09-05 MED ORDER — OXYCODONE HCL 5 MG PO TABS
ORAL_TABLET | ORAL | Status: AC
  Filled 2020-09-05: qty 1

## 2020-09-05 MED ORDER — ARTIFICIAL TEARS OPHTHALMIC OINT
TOPICAL_OINTMENT | OPHTHALMIC | Status: AC
  Filled 2020-09-05: qty 3.5

## 2020-09-05 MED ORDER — DIPHENHYDRAMINE HCL 50 MG/ML IJ SOLN
INTRAMUSCULAR | Status: AC
  Filled 2020-09-05: qty 1

## 2020-09-05 MED ORDER — LIDOCAINE 2% (20 MG/ML) 5 ML SYRINGE
INTRAMUSCULAR | Status: AC
  Filled 2020-09-05: qty 5

## 2020-09-05 MED ORDER — FENTANYL CITRATE (PF) 100 MCG/2ML IJ SOLN
INTRAMUSCULAR | Status: AC
  Filled 2020-09-05: qty 2

## 2020-09-05 MED ORDER — SUGAMMADEX SODIUM 200 MG/2ML IV SOLN
INTRAVENOUS | Status: DC | PRN
  Administered 2020-09-05: 200 mg via INTRAVENOUS

## 2020-09-05 MED ORDER — PROPOFOL 500 MG/50ML IV EMUL
INTRAVENOUS | Status: DC | PRN
  Administered 2020-09-05: 25 ug/kg/min via INTRAVENOUS

## 2020-09-05 MED ORDER — GABAPENTIN 300 MG PO CAPS
300.0000 mg | ORAL_CAPSULE | ORAL | Status: AC
  Administered 2020-09-05: 300 mg via ORAL

## 2020-09-05 MED ORDER — GABAPENTIN 300 MG PO CAPS
ORAL_CAPSULE | ORAL | Status: AC
  Filled 2020-09-05: qty 1

## 2020-09-05 MED ORDER — POVIDONE-IODINE 10 % EX SOLN
CUTANEOUS | Status: DC | PRN
  Administered 2020-09-05: 1 via TOPICAL

## 2020-09-05 MED ORDER — CELECOXIB 200 MG PO CAPS
ORAL_CAPSULE | ORAL | Status: AC
  Filled 2020-09-05: qty 1

## 2020-09-05 MED ORDER — FENTANYL CITRATE (PF) 100 MCG/2ML IJ SOLN
INTRAMUSCULAR | Status: AC
Start: 2020-09-05 — End: ?
  Filled 2020-09-05: qty 2

## 2020-09-05 MED ORDER — MIDAZOLAM HCL 2 MG/2ML IJ SOLN
INTRAMUSCULAR | Status: AC
  Filled 2020-09-05: qty 2

## 2020-09-05 MED ORDER — SODIUM BICARBONATE 4 % IV SOLN
INTRAVENOUS | Status: DC | PRN
  Administered 2020-09-05: 200 mL via INTRAMUSCULAR

## 2020-09-05 MED ORDER — DEXAMETHASONE SODIUM PHOSPHATE 10 MG/ML IJ SOLN
INTRAMUSCULAR | Status: DC | PRN
  Administered 2020-09-05: 5 mg via INTRAVENOUS

## 2020-09-05 MED ORDER — ROCURONIUM BROMIDE 10 MG/ML (PF) SYRINGE
PREFILLED_SYRINGE | INTRAVENOUS | Status: DC | PRN
  Administered 2020-09-05 (×2): 50 mg via INTRAVENOUS

## 2020-09-05 MED ORDER — ACETAMINOPHEN 500 MG PO TABS
1000.0000 mg | ORAL_TABLET | ORAL | Status: AC
  Administered 2020-09-05: 1000 mg via ORAL

## 2020-09-05 MED ORDER — LIDOCAINE HCL (PF) 1 % IJ SOLN
INTRAMUSCULAR | Status: AC
  Filled 2020-09-05: qty 60

## 2020-09-05 MED ORDER — ONDANSETRON HCL 4 MG/2ML IJ SOLN
INTRAMUSCULAR | Status: DC | PRN
  Administered 2020-09-05: 4 mg via INTRAVENOUS

## 2020-09-05 MED ORDER — CEFAZOLIN SODIUM-DEXTROSE 2-4 GM/100ML-% IV SOLN
INTRAVENOUS | Status: AC
  Filled 2020-09-05: qty 100

## 2020-09-05 MED ORDER — OXYCODONE HCL 5 MG/5ML PO SOLN: 5.0000 mg | Freq: Once | ORAL | Status: AC | PRN

## 2020-09-05 MED ORDER — ROCURONIUM BROMIDE 10 MG/ML (PF) SYRINGE
PREFILLED_SYRINGE | INTRAVENOUS | Status: AC
  Filled 2020-09-05: qty 10

## 2020-09-05 MED ORDER — EPINEPHRINE PF 1 MG/ML IJ SOLN
INTRAMUSCULAR | Status: AC
  Filled 2020-09-05: qty 1

## 2020-09-05 MED ORDER — LACTATED RINGERS IV SOLN: INTRAVENOUS | Status: DC

## 2020-09-05 MED ORDER — MIDAZOLAM HCL 2 MG/2ML IJ SOLN
INTRAMUSCULAR | Status: DC | PRN
  Administered 2020-09-05: 2 mg via INTRAVENOUS

## 2020-09-05 MED ORDER — ACETAMINOPHEN 500 MG PO TABS
ORAL_TABLET | ORAL | Status: AC
  Filled 2020-09-05: qty 2

## 2020-09-05 MED ORDER — FENTANYL CITRATE (PF) 250 MCG/5ML IJ SOLN
INTRAMUSCULAR | Status: DC | PRN
Start: 2020-09-05 — End: 2020-09-05
  Administered 2020-09-05 (×3): 50 ug via INTRAVENOUS

## 2020-09-05 MED ORDER — FENTANYL CITRATE (PF) 100 MCG/2ML IJ SOLN
25.0000 ug | INTRAMUSCULAR | Status: DC | PRN
  Administered 2020-09-05 (×2): 50 ug via INTRAVENOUS

## 2020-09-05 MED ORDER — SODIUM CHLORIDE 0.9 % IV SOLN
INTRAVENOUS | Status: DC | PRN
  Administered 2020-09-05: 500 mL

## 2020-09-05 MED ORDER — BUPIVACAINE HCL (PF) 0.25 % IJ SOLN
INTRAMUSCULAR | Status: DC | PRN
  Administered 2020-09-05: 30 mL

## 2020-09-05 MED ORDER — ONDANSETRON HCL 4 MG/2ML IJ SOLN: 4.0000 mg | Freq: Once | INTRAMUSCULAR | Status: DC | PRN

## 2020-09-05 MED ORDER — PROPOFOL 10 MG/ML IV BOLUS
INTRAVENOUS | Status: DC | PRN
  Administered 2020-09-05: 50 mg via INTRAVENOUS
  Administered 2020-09-05: 150 mg via INTRAVENOUS

## 2020-09-05 SURGICAL SUPPLY — 97 items
ADH SKN CLS APL DERMABOND .7 (GAUZE/BANDAGES/DRESSINGS) ×4
APL PRP STRL LF DISP 70% ISPRP (MISCELLANEOUS) ×4
BAG DECANTER FOR FLEXI CONT (MISCELLANEOUS) ×3 IMPLANT
BINDER ABDOMINAL 10 UNV 27-48 (MISCELLANEOUS) ×3 IMPLANT
BINDER ABDOMINAL 12 SM 30-45 (SOFTGOODS) IMPLANT
BINDER BREAST 3XL (GAUZE/BANDAGES/DRESSINGS) IMPLANT
BINDER BREAST LRG (GAUZE/BANDAGES/DRESSINGS) IMPLANT
BINDER BREAST MEDIUM (GAUZE/BANDAGES/DRESSINGS) IMPLANT
BINDER BREAST XLRG (GAUZE/BANDAGES/DRESSINGS) ×3 IMPLANT
BINDER BREAST XXLRG (GAUZE/BANDAGES/DRESSINGS) IMPLANT
BLADE SURG 10 STRL SS (BLADE) ×6 IMPLANT
BLADE SURG 11 STRL SS (BLADE) ×3 IMPLANT
BNDG GAUZE ELAST 4 BULKY (GAUZE/BANDAGES/DRESSINGS) ×6 IMPLANT
CANISTER LIPO FAT HARVEST (MISCELLANEOUS) ×3 IMPLANT
CANISTER SUCT 1200ML W/VALVE (MISCELLANEOUS) ×6 IMPLANT
CHLORAPREP W/TINT 26 (MISCELLANEOUS) ×6 IMPLANT
COVER BACK TABLE 60X90IN (DRAPES) ×3 IMPLANT
COVER MAYO STAND STRL (DRAPES) ×6 IMPLANT
COVER WAND RF STERILE (DRAPES) IMPLANT
DECANTER SPIKE VIAL GLASS SM (MISCELLANEOUS) IMPLANT
DERMABOND ADVANCED (GAUZE/BANDAGES/DRESSINGS) ×2
DERMABOND ADVANCED .7 DNX12 (GAUZE/BANDAGES/DRESSINGS) ×4 IMPLANT
DRAIN CHANNEL 15F RND FF W/TCR (WOUND CARE) ×3 IMPLANT
DRAPE TOP ARMCOVERS (MISCELLANEOUS) ×3 IMPLANT
DRAPE U-SHAPE 76X120 STRL (DRAPES) ×3 IMPLANT
DRAPE UTILITY XL STRL (DRAPES) ×6 IMPLANT
DRSG PAD ABDOMINAL 8X10 ST (GAUZE/BANDAGES/DRESSINGS) ×12 IMPLANT
ELECT BLADE 4.0 EZ CLEAN MEGAD (MISCELLANEOUS) ×3
ELECT COATED BLADE 2.86 ST (ELECTRODE) ×3 IMPLANT
ELECT REM PT RETURN 9FT ADLT (ELECTROSURGICAL) ×3
ELECTRODE BLDE 4.0 EZ CLN MEGD (MISCELLANEOUS) ×2 IMPLANT
ELECTRODE REM PT RTRN 9FT ADLT (ELECTROSURGICAL) ×2 IMPLANT
EVACUATOR SILICONE 100CC (DRAIN) ×3 IMPLANT
EXTRACTOR CANIST REVOLVE STRL (CANNISTER) IMPLANT
FUNNEL KELLER 2 DISP (MISCELLANEOUS) ×3 IMPLANT
GLOVE BIO SURGEON STRL SZ 6 (GLOVE) ×12 IMPLANT
GLOVE BIOGEL PI IND STRL 6.5 (GLOVE) ×2 IMPLANT
GLOVE BIOGEL PI IND STRL 7.0 (GLOVE) ×2 IMPLANT
GLOVE BIOGEL PI INDICATOR 6.5 (GLOVE) ×1
GLOVE BIOGEL PI INDICATOR 7.0 (GLOVE) ×1
GLOVE ECLIPSE 6.5 STRL STRAW (GLOVE) ×9 IMPLANT
GOWN STRL REUS W/ TWL LRG LVL3 (GOWN DISPOSABLE) ×4 IMPLANT
GOWN STRL REUS W/TWL LRG LVL3 (GOWN DISPOSABLE) ×6
IMPL BREAST P6.3XRND MDRT 560 (Breast) ×2 IMPLANT
IMPL BREAST P6.4XRND XFULL 615 (Breast) ×2 IMPLANT
IMPL BRST P6.3XRND MDRT 560CC (Breast) ×2 IMPLANT
IMPL BRST P6.4XRND XFULL 615CC (Breast) ×2 IMPLANT
IMPLANT BREAST GEL 560CC (Breast) ×3 IMPLANT
IMPLANT BREAST GEL 615CC (Breast) ×3 IMPLANT
IV NS 500ML (IV SOLUTION)
IV NS 500ML BAXH (IV SOLUTION) IMPLANT
KIT FILL SYSTEM UNIVERSAL (SET/KITS/TRAYS/PACK) IMPLANT
LINER CANISTER 1000CC FLEX (MISCELLANEOUS) ×3 IMPLANT
MARKER SKIN DUAL TIP RULER LAB (MISCELLANEOUS) IMPLANT
NDL SAFETY ECLIPSE 18X1.5 (NEEDLE) ×8 IMPLANT
NEEDLE FILTER BLUNT 18X 1/2SAF (NEEDLE) ×1
NEEDLE FILTER BLUNT 18X1 1/2 (NEEDLE) ×2 IMPLANT
NEEDLE HYPO 18GX1.5 SHARP (NEEDLE) ×12
NEEDLE HYPO 25X1 1.5 SAFETY (NEEDLE) IMPLANT
NS IRRIG 1000ML POUR BTL (IV SOLUTION) ×3 IMPLANT
PACK BASIN DAY SURGERY FS (CUSTOM PROCEDURE TRAY) ×3 IMPLANT
PAD ALCOHOL SWAB (MISCELLANEOUS) ×12 IMPLANT
PENCIL SMOKE EVACUATOR (MISCELLANEOUS) ×3 IMPLANT
PIN SAFETY STERILE (MISCELLANEOUS) ×3 IMPLANT
PUNCH BIOPSY DERMAL 4MM (MISCELLANEOUS) IMPLANT
SHEET MEDIUM DRAPE 40X70 STRL (DRAPES) ×9 IMPLANT
SIZER BREAST REUSE GEL 560CC (SIZER) ×3
SIZER BREAST REUSE GEL 580CC (SIZER) ×3
SIZER BREAST REUSE XFP 615CC (SIZER) ×3
SIZER BRST REUSE GEL 560CC (SIZER) ×2 IMPLANT
SIZER BRST REUSE GEL 580CC (SIZER) ×2 IMPLANT
SIZER BRST REUSE XFP 615CC (SIZER) ×2 IMPLANT
SLEEVE SCD COMPRESS KNEE MED (MISCELLANEOUS) ×3 IMPLANT
SPONGE LAP 18X18 RF (DISPOSABLE) ×6 IMPLANT
STAPLER VISISTAT 35W (STAPLE) ×3 IMPLANT
SUT ETHILON 2 0 FS 18 (SUTURE) ×3 IMPLANT
SUT MNCRL AB 4-0 PS2 18 (SUTURE) ×6 IMPLANT
SUT PDS AB 2-0 CT2 27 (SUTURE) IMPLANT
SUT VIC AB 3-0 PS1 18 (SUTURE)
SUT VIC AB 3-0 PS1 18XBRD (SUTURE) IMPLANT
SUT VIC AB 3-0 SH 27 (SUTURE) ×6
SUT VIC AB 3-0 SH 27X BRD (SUTURE) ×4 IMPLANT
SUT VICRYL 4-0 PS2 18IN ABS (SUTURE) ×6 IMPLANT
SYR 10ML LL (SYRINGE) ×18 IMPLANT
SYR 20ML LL LF (SYRINGE) ×3 IMPLANT
SYR 50ML LL SCALE MARK (SYRINGE) ×12 IMPLANT
SYR BULB IRRIG 60ML STRL (SYRINGE) IMPLANT
SYR CONTROL 10ML LL (SYRINGE) IMPLANT
SYR TB 1ML LL NO SAFETY (SYRINGE) ×3 IMPLANT
SYR TOOMEY IRRIG 70ML (MISCELLANEOUS)
SYRINGE TOOMEY IRRIG 70ML (MISCELLANEOUS) IMPLANT
TOWEL GREEN STERILE FF (TOWEL DISPOSABLE) ×3 IMPLANT
TUBE CONNECTING 20X1/4 (TUBING) ×6 IMPLANT
TUBING INFILTRATION IT-10001 (TUBING) ×3 IMPLANT
TUBING SET GRADUATE ASPIR 12FT (MISCELLANEOUS) ×3 IMPLANT
UNDERPAD 30X36 HEAVY ABSORB (UNDERPADS AND DIAPERS) ×6 IMPLANT
YANKAUER SUCT BULB TIP NO VENT (SUCTIONS) ×3 IMPLANT

## 2020-09-05 NOTE — Anesthesia Procedure Notes (Signed)
Procedure Name: Intubation Date/Time: 09/05/2020 7:29 AM Performed by: Alain Marion, CRNA Pre-anesthesia Checklist: Patient identified, Emergency Drugs available, Suction available and Patient being monitored Patient Re-evaluated:Patient Re-evaluated prior to induction Oxygen Delivery Method: Circle System Utilized Preoxygenation: Pre-oxygenation with 100% oxygen Induction Type: IV induction Ventilation: Mask ventilation without difficulty and Oral airway inserted - appropriate to patient size Laryngoscope Size: Sabra Heck and 2 Grade View: Grade I Tube type: Oral Tube size: 6.5 mm Number of attempts: 1 Airway Equipment and Method: Stylet and Oral airway Placement Confirmation: ETT inserted through vocal cords under direct vision,  positive ETCO2 and breath sounds checked- equal and bilateral Secured at: 21 cm Tube secured with: Tape Dental Injury: Teeth and Oropharynx as per pre-operative assessment

## 2020-09-05 NOTE — Anesthesia Preprocedure Evaluation (Signed)

## 2020-09-05 NOTE — Anesthesia Postprocedure Evaluation (Signed)
Anesthesia Post Note  Patient: Sandra Macias  Procedure(s) Performed: REMOVAL OF BILATERAL TISSUE EXPANDERS WITH PLACEMENT OF BILATERAL BREAST IMPLANTS (Bilateral Breast) LIPOFILLING FROM ABDOMEN TO BILATERAL CHEST (Bilateral Abdomen)     Patient location during evaluation: PACU Anesthesia Type: General Level of consciousness: awake and alert Pain management: pain level controlled Vital Signs Assessment: post-procedure vital signs reviewed and stable Respiratory status: spontaneous breathing, nonlabored ventilation, respiratory function stable and patient connected to nasal cannula oxygen Cardiovascular status: blood pressure returned to baseline and stable Postop Assessment: no apparent nausea or vomiting Anesthetic complications: no   No complications documented.  Last Vitals:  Vitals:   09/05/20 1200 09/05/20 1215  BP: 109/75 123/83  Pulse: 84 85  Resp: 12 16  Temp:  36.8 C  SpO2: 99% 100%    Last Pain:  Vitals:   09/05/20 1215  TempSrc:   PainSc: 3                  Abbee Cremeens COKER

## 2020-09-05 NOTE — Op Note (Signed)
Operative Note   DATE OF OPERATION: 10.12.21  LOCATION: [facility]-outpatient  SURGICAL DIVISION: Plastic Surgery  PREOPERATIVE DIAGNOSES:  1. Acquired absence breasts 2. BRCA2  POSTOPERATIVE DIAGNOSES:  same  PROCEDURE:  1. Removal bilateral chest tissue expanders and placement silicone implants 2. Lipofilling to bilateral chest total 130 ml  SURGEON: Irene Limbo MD MBA  ASSISTANT: none  ANESTHESIA:  General.   EBL: 50 ml  COMPLICATIONS: None immediate.   INDICATIONS FOR PROCEDURE:  The patient, Sandra Macias, is a 44 y.o. female born on 10/26/1976, is here for staged breast reconstruction following nipple sparing mastectomies and prepectoral tissue expander acellular dermis reconstruction.   FINDINGS: Full incorporation ADM noted bilateral. Over right chest, thick capsule noted and extensive capsulectomy performed. Soft tissue thickness mastectomy flap greater on right vs left chest. 50 ml fat infiltrated over right chest, 80 ml fat infiltrated over left chest. Natrelle Inspira Smooth Round Extra Projection implants placed bilateral. RIGHT 560 ml REF SRX-560 [id number] LEFT 615 ml REF SRX-615 [id number]   DESCRIPTION OF PROCEDURE:  The patient's operative site was marked with the patient in the preoperative area to mark sternal notch, chest midline, anterior axillary lines.Supraumbiliical abdomen and bilateral hipslmarked for liposuction. Areas of depression superior poles and lateral chest wall marked for filling.The patientwas taken to the operating room. SCDs were placed and IV antibiotics were given. The patient's operative site was prepped and draped in a sterile fashion. A time out was performed and all information was confirmed to be correct.I began onleftside. Incision made through priorinframammary foldscar and carried through superficial fascia toacellular demis.ADM incised.Expander removed. Capsulotomy performed superiorly.Sizer placed.  I  then directed attention torightchest. Incision made in inframammary fold scar and carried through superficial fascia and acellular dermis. Expander removed and well incorporated ADM noted.Thick capsule noted and extensive capsulectomy performed over anterior medial and lateral capsule. This included excision incorporated acellular dermis in these areas. Sizer placed and patient brought to upright sitting position. Natrelle Smooth RoundExtra Projection560 ml implant selected for right chest and 615 ml implant elected for left chest.The patient was returned to supine position.  Stab incision made over bilateralabdomen in area of prior scars and tumescent fluid infiltrated over supraumbilical abdomen and lateral hips,total230m tumescent infiltrated. Power assisted liposuction performed to endpoint symmetric contour and soft tissue thickness, total lipoaspirate2048m The fat was then washed and prepared by gravity for infiltration. Harvested fat was then infiltrated in subcutaneous plane throughout total envelope mastectomy flaps.  Each cavity irrigated with saline followed by betadine, Ancef, gentamicinsolution.Hemostasis ensured. The implant was placed inleft chest with aid Keller funnel, andimplant orientation ensured. Closure completed with 3-0 vicryl to closesuperficial fascia and ADMover implant. 3-0 and4-0 vicryl used to close dermis followed by 4-0 monocryl subcuticular. 15 Fr JP placed in right chest and secured with 2-0 nylon. Implant placed inrightchest cavity with aid Keller funnel and implant orientation ensured.Closure completedwith 3-0 vicryl in superficial fascia and capsule, 4-0 vicryl in dermis, and 4-0 monocryl subcuticular skin closure. Liposuction port incisions approximated with simple 4-0 monocryl stitch.Tissue adhesiveapplied to chest incisions. Dry dressing, followed by breastbinder and abdominal binderapplied.  The patient was allowed to wake from  anesthesia, extubated and taken to the recovery room in satisfactory condition.   SPECIMENS: none  DRAINS: 15 Fr JP in right breast reconstruction

## 2020-09-05 NOTE — Discharge Instructions (Signed)
NO TYLENOL PRODUCTS UNTIL 12:40 PM  NO ADVIL/ MOTRIN UNTIL 2:40 PM  Oxycodone 5mg  given at 12:30 p.m.  Post Anesthesia Home Care Instructions  Activity: Get plenty of rest for the remainder of the day. A responsible individual must stay with you for 24 hours following the procedure.  For the next 24 hours, DO NOT: -Drive a car -Paediatric nurse -Drink alcoholic beverages -Take any medication unless instructed by your physician -Make any legal decisions or sign important papers.  Meals: Start with liquid foods such as gelatin or soup. Progress to regular foods as tolerated. Avoid greasy, spicy, heavy foods. If nausea and/or vomiting occur, drink only clear liquids until the nausea and/or vomiting subsides. Call your physician if vomiting continues.  Special Instructions/Symptoms: Your throat may feel dry or sore from the anesthesia or the breathing tube placed in your throat during surgery. If this causes discomfort, gargle with warm salt water. The discomfort should disappear within 24 hours.  If you had a scopolamine patch placed behind your ear for the management of post- operative nausea and/or vomiting:  1. The medication in the patch is effective for 72 hours, after which it should be removed.  Wrap patch in a tissue and discard in the trash. Wash hands thoroughly with soap and water. 2. You may remove the patch earlier than 72 hours if you experience unpleasant side effects which may include dry mouth, dizziness or visual disturbances. 3. Avoid touching the patch. Wash your hands with soap and water after contact with the patch.    About my Jackson-Pratt Bulb Drain  What is a Jackson-Pratt bulb? A Jackson-Pratt is a soft, round device used to collect drainage. It is connected to a long, thin drainage catheter, which is held in place by one or two small stiches near your surgical incision site. When the bulb is squeezed, it forms a vacuum, forcing the drainage to empty into the  bulb.  Emptying the Jackson-Pratt bulb- To empty the bulb: 1. Release the plug on the top of the bulb. 2. Pour the bulb's contents into a measuring container which your nurse will provide. 3. Record the time emptied and amount of drainage. Empty the drain(s) as often as your     doctor or nurse recommends.  Date                  Time                    Amount (Drain 1)                 Amount (Drain 2)  _____________________________________________________________________  _____________________________________________________________________  _____________________________________________________________________  _____________________________________________________________________  _____________________________________________________________________  _____________________________________________________________________  _____________________________________________________________________  _____________________________________________________________________  Squeezing the Jackson-Pratt Bulb- To squeeze the bulb: 1. Make sure the plug at the top of the bulb is open. 2. Squeeze the bulb tightly in your fist. You will hear air squeezing from the bulb. 3. Replace the plug while the bulb is squeezed. 4. Use a safety pin to attach the bulb to your clothing. This will keep the catheter from     pulling at the bulb insertion site.  When to call your doctor- Call your doctor if:  Drain site becomes red, swollen or hot.  You have a fever greater than 101 degrees F.  There is oozing at the drain site.  Drain falls out (apply a guaze bandage over the drain hole and secure it with tape).  Drainage increases daily not related to  activity patterns. (You will usually have more drainage when you are active than when you are resting.)  Drainage has a bad odor.

## 2020-09-05 NOTE — Transfer of Care (Signed)
Immediate Anesthesia Transfer of Care Note  Patient: Sandra Macias  Procedure(s) Performed: REMOVAL OF BILATERAL TISSUE EXPANDERS WITH PLACEMENT OF BILATERAL BREAST IMPLANTS (Bilateral Breast) LIPOFILLING FROM ABDOMEN TO BILATERAL CHEST (Bilateral Abdomen)  Patient Location: PACU  Anesthesia Type:General  Level of Consciousness: awake, alert  and oriented  Airway & Oxygen Therapy: Patient Spontanous Breathing and Patient connected to nasal cannula oxygen  Post-op Assessment: Report given to RN and Post -op Vital signs reviewed and stable  Post vital signs: Reviewed and stable  Last Vitals:  Vitals Value Taken Time  BP 122/78 09/05/20 1045  Temp    Pulse 84 09/05/20 1047  Resp 13 09/05/20 1047  SpO2 100 % 09/05/20 1047  Vitals shown include unvalidated device data.  Last Pain:  Vitals:   09/05/20 0636  TempSrc: Oral  PainSc: 0-No pain         Complications: No complications documented.

## 2020-09-05 NOTE — Interval H&P Note (Signed)
History and Physical Interval Note:  09/05/2020 6:56 AM  Sandra Macias  has presented today for surgery, with the diagnosis of BRCA2, acquired absence breasts.  The various methods of treatment have been discussed with the patient and family. After consideration of risks, benefits and other options for treatment, the patient has consented to  Procedure(s): REMOVAL OF BILATERAL TISSUE EXPANDERS WITH PLACEMENT OF BILATERAL BREAST IMPLANTS (Bilateral) LIPOFILLING FROM ABDOMEN TO BILATERAL CHEST (Bilateral) as a surgical intervention.  The patient's history has been reviewed, patient examined, no change in status, stable for surgery.  I have reviewed the patient's chart and labs.  Questions were answered to the patient's satisfaction.     Arnoldo [place] Tajuana Kniskern

## 2020-09-06 ENCOUNTER — Encounter (HOSPITAL_BASED_OUTPATIENT_CLINIC_OR_DEPARTMENT_OTHER): Payer: Self-pay | Admitting: Plastic Surgery

## 2023-03-26 ENCOUNTER — Encounter: Payer: Self-pay | Admitting: Internal Medicine

## 2023-03-26 ENCOUNTER — Ambulatory Visit (INDEPENDENT_AMBULATORY_CARE_PROVIDER_SITE_OTHER): Payer: 59 | Admitting: Internal Medicine

## 2023-03-26 VITALS — BP 116/78 | HR 98 | Temp 97.9°F | Resp 17 | Ht 66.0 in | Wt 204.1 lb

## 2023-03-26 DIAGNOSIS — J3089 Other allergic rhinitis: Secondary | ICD-10-CM | POA: Diagnosis not present

## 2023-03-26 DIAGNOSIS — T7802XA Anaphylactic reaction due to shellfish (crustaceans), initial encounter: Secondary | ICD-10-CM

## 2023-03-26 DIAGNOSIS — T7802XD Anaphylactic reaction due to shellfish (crustaceans), subsequent encounter: Secondary | ICD-10-CM

## 2023-03-26 MED ORDER — TRIAMCINOLONE ACETONIDE 55 MCG/ACT NA AERO: 2.0000 | INHALATION_SPRAY | Freq: Every day | NASAL | 5 refills | Status: DC

## 2023-03-26 NOTE — Patient Instructions (Addendum)
Chronic Rhinitis:  - Use nasal saline rinses before nose sprays such as with Neilmed Sinus Rinse.  Use distilled water.   - Use Nasacort 2 sprays each nostril daily. Aim upward and outward. - Hold all anti histamines 3 days prior to next visit for skin testing.   Food allergy:  - please strictly avoid crab - for SKIN only reaction, okay to take Benadryl 25mg  capsules every 6 hours   04/02/2023: 9:00 AM overbook with me for skin testing  Fran Lowes

## 2023-03-26 NOTE — Progress Notes (Signed)
NEW PATIENT  Date of Service/Encounter:  03/26/23  Consult requested by: Patient, No Pcp Per   Subjective:   Sandra Macias (DOB: August 23, 1976) is a 47 y.o. female who presents to the clinic on 03/26/2023 with a chief complaint of Allergic Rhinitis  (seasonal) and Food Intolerance (Crab eaten 28 years ago and her throat had closed. She has avoided ever since. She wants to see if she is still allergic to the crab.) .    History obtained from: chart review and patient.    Rhinitis:  Started  Symptoms include: nasal congestion, rhinorrhea, post nasal drainage, watery eyes, and itchy eyes  Occurs year around; previously in [place] was mostly in Fall Potential triggers: pollen  Treatments tried:  Zyrtec daily Ketorolac for eyes  Singulair; it gives her a headache though  Has required oral prednisone previously   Previous allergy testing: no History of sinus surgery: no Nonallergic triggers: none   Concern for Food Allergy:  Foods of concern:  Crab  History of reaction:  Ate crab about 28 years ago, had puffiness around the eyes, itchy eyes. She then woke up and felt like her throat closed and had trouble breathing/swallowing.  She did not have to go the ER.   Eats shrimp, scallops, octopus and lobster. Contact to raw seafood causes localized itching but does fine when she wears gloves.    Carries an epinephrine autoinjector: yes but it is expired    Past Medical History: Past Medical History:  Diagnosis Date   Cancer (HCC)    non hodgkins lymphoma, had chemotherapy x 6 until 05-14-19   Constipation    Past Surgical History: Past Surgical History:  Procedure Laterality Date   ABDOMINOPLASTY     2010   BREAST RECONSTRUCTION WITH PLACEMENT OF TISSUE EXPANDER AND ALLODERM Bilateral 06/27/2020   Procedure: BREAST RECONSTRUCTION WITH PLACEMENT OF TISSUE EXPANDER AND ALLODERM;  Surgeon: Glenna Fellows, MD;  Location: [facility];  Service: Plastics;   Laterality: Bilateral;   BREAST REDUCTION SURGERY Bilateral 03/10/2020   Procedure: MAMMARY REDUCTION  (BREAST);  Surgeon: Glenna Fellows, MD;  Location: [facility];  Service: Plastics;  Laterality: Bilateral;   CESAREAN SECTION     2000 2007   DILATATION & CURRETTAGE/HYSTEROSCOPY WITH RESECTOCOPE N/A 07/21/2015   Procedure: DILATATION & CURETTAGE/HYSTEROSCOPY WITH RESECTOCOPE;  Surgeon: Maxie Better, MD;  Location: [facility];  Service: Gynecology;  Laterality: N/A;   DILATION AND CURETTAGE OF UTERUS     LIPOSUCTION WITH LIPOFILLING Bilateral 09/05/2020   Procedure: LIPOFILLING FROM ABDOMEN TO BILATERAL CHEST;  Surgeon: Glenna Fellows, MD;  Location: [facility];  Service: Plastics;  Laterality: Bilateral;   NIPPLE SPARING MASTECTOMY Bilateral 06/27/2020   Procedure: BILATERAL RISK REDUCING NIPPLE SPARING MASTECTOMY;  Surgeon: Emelia Loron, MD;  Location: [facility];  Service: General;  Laterality: Bilateral;  PEC BLOCK   port a cath insertion  01/28/2019   removed May 27, 2019   REMOVAL OF BILATERAL TISSUE EXPANDERS WITH PLACEMENT OF BILATERAL BREAST IMPLANTS Bilateral 09/05/2020   Procedure: REMOVAL OF BILATERAL TISSUE EXPANDERS WITH PLACEMENT OF BILATERAL BREAST IMPLANTS;  Surgeon: Glenna Fellows, MD;  Location:  SURGERY CENTER;  Service: Plastics;  Laterality: Bilateral;   ROBOTIC ASSISTED SALPINGO OOPHERECTOMY Bilateral 11/11/2019   Procedure: XI ROBOTIC ASSISTED SALPINGO OOPHORECTOMY/With Pelvic Washings;  Surgeon: Maxie Better, MD;  Location: [facility];  Service: Gynecology;  Laterality: Bilateral;  Requests 90 min.    Family History: History reviewed. No  pertinent family history.  Social History:  Lives in a 18 year house Flooring in bedroom: wood Pets:  parrots Tobacco use/exposure: none Job: Development worker, [employer]   Medication List:  Allergies as of 03/26/2023       Reactions   Crab  (diagnostic) Anaphylaxis   Other Anaphylaxis   Crab throat closes   Shellfish Allergy Anaphylaxis   Crab throat closes        Medication List        Accurate as of Mar 26, 2023  3:18 PM. If you have any questions, ask your nurse or doctor.          STOP taking these medications    LINZESS PO Stopped by: Birder Robson, MD   sulfamethoxazole-trimethoprim 800-160 MG tablet Commonly known as: BACTRIM DS Stopped by: Birder Robson, MD       TAKE these medications    cetirizine 10 MG tablet Commonly known as: ZYRTEC Take 10 mg by mouth daily.   EPINEPHrine 0.3 mg/0.3 mL Soaj injection Commonly known as: EPI-PEN Inject 0.3 mg into the muscle as needed for anaphylaxis.   gabapentin 300 MG capsule Commonly known as: NEURONTIN Take 300 mg by mouth 3 (three) times daily.   valACYclovir 500 MG tablet Commonly known as: VALTREX Take 500 mg by mouth 2 (two) times daily as needed. Takes 4 tabs every 12 hours prn         REVIEW OF SYSTEMS: Pertinent positives and negatives discussed in HPI.   Objective:   Physical Exam: BP 116/78 (BP Location: Left Arm, Patient Position: Sitting, Cuff Size: Normal)   Pulse 98   Temp 97.9 F (36.6 C) (Temporal)   Resp 17   Ht 5\' 6"  (1.676 m)   Wt 204 lb 1.6 oz (92.6 kg)   SpO2 99%   BMI 32.94 kg/m  Body mass index is 32.94 kg/m. GEN: alert, well developed HEENT: clear conjunctiva, TM grey and translucent, nose with + inferior turbinate hypertrophy, pink nasal mucosa, slight clear rhinorrhea, + cobblestoning HEART: regular rate and rhythm, no murmur LUNGS: clear to auscultation bilaterally, no coughing, unlabored respiration ABDOMEN: soft, non distended  SKIN: no rashes or lesions  Reviewed:  03/25/2023: seen by Dr. Jacqulyn Bath for seasonal allergic rhinitis, stable on Zyrtec.  Also is BRCA 2 positive s/p bl mastectomy and oophorectomy. Also has lymphoma s/p chemo with neuropathy.    03/19/2023: seen by Dr. Leta Baptist. She is 2.5  yrs post op bilateral mastectomies with immediate reconstruction. Having intermittent chest pain radiating to neck. Obtained US with some malpositioning but implants were grossly intact.    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   Assessment:   1. Anaphylactic shock due to shellfish, initial encounter   2. Other allergic rhinitis     Plan/Recommendations:  Chronic Rhinitis: - Due to turbinate hypertrophy and unresponsive to OTC meds with severe symptoms, performed skin testing to identify aeroallergen triggers.   - Use nasal saline rinses before nose sprays such as with Neilmed Sinus Rinse.  Use distilled water.   - Use Nasacort 2 sprays each nostril daily. Aim upward and outward. - Hold all anti histamines 3 days prior.   Food allergy:  - Initial rxn with itching and throat closure with crab ingestion but now eating and tolerating other shellfish.   - please strictly avoid crab for now.  We will  do skin testing at next visit.   - for SKIN only reaction, okay to take Benadryl 25mg  capsules every 6 hours  Return in about 1 week (around 04/02/2023). Skin test for 1-59 and shellfish mix + individual shellfish.   Alesia Morin, MD [facility]

## 2023-04-02 ENCOUNTER — Ambulatory Visit (INDEPENDENT_AMBULATORY_CARE_PROVIDER_SITE_OTHER): Payer: 59 | Admitting: Internal Medicine

## 2023-04-02 DIAGNOSIS — T7802XA Anaphylactic reaction due to shellfish (crustaceans), initial encounter: Secondary | ICD-10-CM | POA: Diagnosis not present

## 2023-04-02 DIAGNOSIS — T7802XD Anaphylactic reaction due to shellfish (crustaceans), subsequent encounter: Secondary | ICD-10-CM

## 2023-04-02 DIAGNOSIS — J302 Other seasonal allergic rhinitis: Secondary | ICD-10-CM | POA: Diagnosis not present

## 2023-04-02 DIAGNOSIS — J3089 Other allergic rhinitis: Secondary | ICD-10-CM

## 2023-04-02 MED ORDER — LEVOCETIRIZINE DIHYDROCHLORIDE 5 MG PO TABS: 5.0000 mg | ORAL_TABLET | Freq: Every evening | ORAL | 5 refills | Status: DC

## 2023-04-02 MED ORDER — AZELASTINE HCL 0.1 % NA SOLN: 1.0000 | Freq: Two times a day (BID) | NASAL | 5 refills | Status: DC | PRN

## 2023-04-02 MED ORDER — OLOPATADINE HCL 0.2 % OP SOLN
1.0000 [drp] | Freq: Every day | OPHTHALMIC | 5 refills | Status: DC | PRN
Start: 2023-04-02 — End: 2023-06-10

## 2023-04-02 NOTE — Patient Instructions (Addendum)
Allergic Rhinitis: - Due to turbinate hypertrophy and unresponsive to OTC meds, performed skin testing to identify aeroallergen triggers.   - Positive skin test 03/2023: trees, grasses, weeds, mold, dust mite, cat, dog, mouse, feathers, cockroach  - Avoidance measures discussed. - Use nasal saline rinses before nose sprays such as with Neilmed Sinus Rinse.  Use distilled water.   - Use Nasacort 2 sprays each nostril daily. Aim upward and outward. - Use Azelastine 1-2 sprays each nostril twice daily as needed. Aim upward and outward. - Use Xyzal 5mg  daily.  - For eyes, use Olopatadine or Ketotifen 1 eye drop daily as needed for itchy, watery eyes.  Available over the counter, if not covered by insurance.  - Consider allergy shots as long term control of your symptoms by teaching your immune system to be more tolerant of your allergy triggers  History of Shellfish Allergy - Was eating shrimp regularly without any issues but avoiding crab.  Your skin test was negative to all shellfish today; you are free to eat any shellfish.    ALLERGEN AVOIDANCE MEASURES   Dust Mites Use central air conditioning and heat; and change the filter monthly.  Pleated filters work better than mesh filters.  Electrostatic filters may also be used; wash the filter monthly.  Window air conditioners may be used, but do not clean the air as well as a central air conditioner.  Change or wash the filter monthly. Keep windows closed.  Do not use attic fans.   Encase the mattress, box springs and pillows with zippered, dust proof covers. Wash the bed linens in hot water weekly.   Remove carpet, especially from the bedroom. Remove stuffed animals, throw pillows, dust ruffles, heavy drapes and other items that collect dust from the bedroom. Do not use a humidifier.   Use wood, vinyl or leather furniture instead of cloth furniture in the bedroom. Keep the indoor humidity at 30 - 40%.  Monitor with a humidity gauge.  Molds -  Indoor avoidance Use air conditioning to reduce indoor humidity.  Do not use a humidifier. Keep indoor humidity at 30 - 40%.  Use a dehumidifier if needed. In the bathroom use an exhaust fan or open a window after showering.  Wipe down damp surfaces after showering.  Clean bathrooms with a mold-killing solution (diluted bleach, or products like Tilex, etc) at least once a month. In the kitchen use an exhaust fan to remove steam from cooking.  Throw away spoiled foods immediately, and empty garbage daily.  Empty water pans below self-defrosting refrigerators frequently. Vent the clothes dryer to the outside. Limit indoor houseplants; mold grows in the dirt.  No houseplants in the bedroom. Remove carpet from the bedroom. Encase the mattress and box springs with a zippered encasing.  Molds - Outdoor avoidance Avoid being outside when the grass is being mowed, or the ground is tilled. Avoid playing in leaves, pine straw, hay, etc.  Dead plant materials contain mold. Avoid going into barns or grain storage areas. Remove leaves, clippings and compost from around the home.  Cockroach Limit spread of food around the house; especially keep food out of bedrooms. Keep food and garbage in closed containers with a tight lid.  Never leave food out in the kitchen.  Do not leave out pet food or dirty food bowls. Mop the kitchen floor and wash countertops at least once a week. Repair leaky pipes and faucets so there is no standing water to attract roaches. Plug up cracks in  the house through which cockroaches can enter. Use bait stations and approved pesticides to reduce cockroach infestation. Pollen Avoidance Pollen levels are highest during the mid-day and afternoon.  Consider this when planning outdoor activities. Avoid being outside when the grass is being mowed, or wear a mask if the pollen-allergic person must be the one to mow the grass. Keep the windows closed to keep pollen outside of the  home. Use an air conditioner to filter the air. Take a shower, wash hair, and change clothing after working or playing outdoors during pollen season. Pet Dander Keep the pet out of your bedroom and restrict it to only a few rooms. Be advised that keeping the pet in only one room will not limit the allergens to that room. Don't pet, hug or kiss the pet; if you do, wash your hands with soap and water. High-efficiency particulate air (HEPA) cleaners run continuously in a bedroom or living room can reduce allergen levels over time. Regular use of a high-efficiency vacuum cleaner or a central vacuum can reduce allergen levels. Giving your pet a bath at least once a week can reduce airborne allergen.   Return in about 2 months (around 06/02/2023).      Fran Lowes

## 2023-04-02 NOTE — Addendum Note (Signed)
Addended byAlesia Morin on: 04/02/2023 10:07 AM   Modules accepted: Orders

## 2023-04-02 NOTE — Progress Notes (Signed)
FOLLOW UP Date of Service/Encounter:  04/02/23   Subjective:  Sandra Macias (DOB: 1976-10-10) is a 47 y.o. female who [facility] for follow up for skin testing.  History obtained from: chart review and patient. Doing well, held anti histamines.   Past Medical History: Past Medical History:  Diagnosis Date   Cancer (HCC)    non hodgkins lymphoma, had chemotherapy x 6 until 05-14-19   Constipation     Objective:  There were no vitals taken for this visit. There is no height or weight on file to calculate BMI. Physical Exam: GEN: alert, well developed HEENT: clear conjunctiva, MMM HEART: regular rate  LUNGS:  no coughing, unlabored respiration SKIN: no rashes or lesions  Skin Testing:  Skin prick testing was placed, which includes aeroallergens/foods, histamine control, and saline control.  Verbal consent was obtained prior to placing test.  Patient tolerated procedure well.  Allergy testing results were read and interpreted by myself, documented by clinical staff. Adequate positive and negative control.  Positive results to:  Results discussed with patient/family.  Airborne Adult Perc - 04/02/23 0918     Time Antigen Placed 0915    Allergen Manufacturer Waynette Buttery    Location Back    Number of Test 59    Panel 1 Select    1. Control-Buffer 50% Glycerol Negative    2. Control-Histamine 1 mg/ml 3+    3. Albumin saline Negative    4. Bahia 4+    5. French Southern [place] 4+    6. Johnson 4+    7. Kentucky Blue 4+    8. Meadow Fescue Negative    9. Perennial Rye Negative    10. Sweet Vernal 3+    11. Timothy 4+    12. Cocklebur 3+    13. Burweed Marshelder 3+    14. Ragweed, short 4+    15. Ragweed, Giant Negative    16. Plantain,  English 2+    17. Lamb's Quarters 3+    18. Sheep Sorrell Negative    19. Rough Pigweed 3+    20. Marsh Elder, Rough Negative    21. Mugwort, Common Negative    22. Ash mix Negative    23. Birch mix 3+     24. Beech American 3+    25. Box, Elder 3+    26. Cedar, red 3+    27. Cottonwood, Eastern 3+    28. Elm mix Negative    29. Hickory 3+    30. Maple mix Negative    31. Oak, Guinea-Bissau mix 4+    32. Pecan Pollen 3+    33. Pine mix 2+    34. Sycamore Eastern 2+    35. Walnut, Black Pollen 3+    36. Alternaria alternata Negative    37. Cladosporium Herbarum Negative    38. Aspergillus mix Negative    39. Penicillium mix Negative    40. Bipolaris sorokiniana (Helminthosporium) Negative    41. Drechslera spicifera (Curvularia) 3+    42. Mucor plumbeus Negative    43. Fusarium moniliforme Negative    44. Aureobasidium pullulans (pullulara) Negative    45. Rhizopus oryzae Negative    46. Botrytis cinera Negative    47. Epicoccum nigrum Negative    48. Phoma betae Negative    49. Candida Albicans Negative    50. Trichophyton mentagrophytes Negative    51. Mite, D Farinae  5,000 AU/ml 4+    52. Mite, D Pteronyssinus  5,000 AU/ml 4+    53. Cat Hair 10,000 BAU/ml 3+    54.  Dog Epithelia 3+    55. Mixed Feathers 2+    56. Horse Epithelia Negative    57. Cockroach, German 3+    58. Mouse 3+    59. Tobacco Leaf Negative             Food Adult Perc - 04/02/23 0900     Time Antigen Placed 1610    Allergen Manufacturer Waynette Buttery    Location Back    Number of allergen test 6    8. Shellfish Mix Negative    25. Shrimp Negative    26. Crab Negative    27. Lobster Negative    28. Oyster Negative    29. Scallops Negative              Assessment:   1. Seasonal and perennial allergic rhinitis   2. Anaphylactic shock due to shellfish, subsequent encounter     Plan/Recommendations:  Allergic Rhinitis: - Due to turbinate hypertrophy, unresponsive to OTC meds and requiring oral prednisone for flare ups, performed skin testing to identify aeroallergen triggers.   - Positive skin test 03/2023: trees, grasses, weeds, mold, dust mite, cat, dog, mouse, feathers, cockroach  -  Avoidance measures discussed. - Use nasal saline rinses before nose sprays such as with Neilmed Sinus Rinse.  Use distilled water.   - Use Nasacort 2 sprays each nostril daily. Aim upward and outward. - Use Azelastine 1-2 sprays each nostril twice daily as needed. Aim upward and outward. - Use Xyzal 5mg  daily.  - For eyes, use Olopatadine or Ketotifen 1 eye drop daily as needed for itchy, watery eyes.  Available over the counter, if not covered by insurance.  - Consider allergy shots as long term control of your symptoms by teaching your immune system to be more tolerant of your allergy triggers  History of Shellfish Allergy - Was eating shrimp regularly without any issues but avoiding crab.  Your skin test was negative to all shellfish today; you are free to eat any shellfish.      Return in about 2 months (around 06/02/2023).  Alesia Morin, MD [facility]

## 2023-06-04 ENCOUNTER — Ambulatory Visit: Payer: 59 | Admitting: Internal Medicine

## 2023-06-10 ENCOUNTER — Encounter: Payer: Self-pay | Admitting: Internal Medicine

## 2023-06-10 ENCOUNTER — Other Ambulatory Visit: Payer: Self-pay

## 2023-06-10 ENCOUNTER — Ambulatory Visit: Payer: 59 | Admitting: Internal Medicine

## 2023-06-10 VITALS — BP 110/80 | HR 87 | Temp 98.5°F | Ht 66.0 in | Wt 202.1 lb

## 2023-06-10 DIAGNOSIS — J3089 Other allergic rhinitis: Secondary | ICD-10-CM

## 2023-06-10 DIAGNOSIS — J302 Other seasonal allergic rhinitis: Secondary | ICD-10-CM | POA: Diagnosis not present

## 2023-06-10 MED ORDER — TRIAMCINOLONE ACETONIDE 55 MCG/ACT NA AERO: 2.0000 | INHALATION_SPRAY | Freq: Every day | NASAL | 11 refills | Status: AC

## 2023-06-10 MED ORDER — AZELASTINE HCL 0.1 % NA SOLN: 1.0000 | Freq: Two times a day (BID) | NASAL | 11 refills | Status: AC | PRN

## 2023-06-10 MED ORDER — LEVOCETIRIZINE DIHYDROCHLORIDE 5 MG PO TABS: 5.0000 mg | ORAL_TABLET | Freq: Every day | ORAL | 11 refills | Status: AC | PRN

## 2023-06-10 MED ORDER — OLOPATADINE HCL 0.2 % OP SOLN: 1.0000 [drp] | Freq: Every day | OPHTHALMIC | 11 refills | Status: AC | PRN

## 2023-06-10 NOTE — Patient Instructions (Addendum)
Allergic Rhinitis: - Positive skin test 03/2023: trees, grasses, weeds, mold, dust mite, cat, dog, mouse, feathers, cockroach  - Avoidance measures discussed. - Use nasal saline rinses before nose sprays such as with Neilmed Sinus Rinse.  Use distilled water.   - If symptoms worsen, use Nasacort 2 sprays each nostril daily. Aim upward and outward. - Use Azelastine 1-2 sprays each nostril twice daily as needed. Aim upward and outward. - Use Xyzal 5mg  daily as needed runny nose, sneezing, itchy watery eyes.  - For eyes, use Olopatadine or Ketotifen 1 eye drop daily as needed for itchy, watery eyes.  Available over the counter, if not covered by insurance.  - Of note, she is followed by Oncology for large cell lymphoma.

## 2023-06-10 NOTE — Progress Notes (Signed)
   FOLLOW UP Date of Service/Encounter:  06/10/23   Subjective:  Sandra Macias (DOB: [birth year]) is a 47 y.o. female who [facility]23 for follow up for allergic rhinoconjunctivitis.   History obtained from: chart review and patient. Last visit was with me on 04/02/2023 and was SPT positive to multiple allergens so we discussed Nasacort, Azelastine, Xyzal, Olopatadine.  Also there was concern for crab allergy but was eating shrimp and SPT was negative to all shellfish so we discussed introduction as she wishes.   Tolerated crab at home since last visit.  She asked about Epipen but we discussed she does not need this since she does not have  shellfish allergy.  Overall doing well otherwise. Has not had much issues with congestion, rhinorrhea, sneezing, itchy watery eyes.  Taking Nasacort, Xyzal, Olopatadine PRN.  Did not need Azelastine.    Past Medical History: Past Medical History:  Diagnosis Date   Cancer (HCC)    non hodgkins lymphoma, had chemotherapy x 6 until 05-14-19   Constipation     Objective:  BP 110/80   Pulse 87   Temp 98.5 F (36.9 C)   Ht 5\' 6"  (1.676 m)   Wt 202 lb 1.6 oz (91.7 kg)   SpO2 98%   BMI 32.62 kg/m  Body mass index is 32.62 kg/m. Physical Exam: GEN: alert, well developed HEENT: clear conjunctiva, TM grey and translucent, nose with mild inferior turbinate hypertrophy, pink nasal mucosa, no rhinorrhea, no cobblestoning HEART: regular rate and rhythm, no murmur LUNGS: clear to auscultation bilaterally, no coughing, unlabored respiration SKIN: no rashes or lesions  Assessment:   1. Seasonal and perennial allergic rhinitis     Plan/Recommendations:   Allergic Rhinitis: - Controlled.  - Positive skin test 03/2023: trees, grasses, weeds, mold, dust mite, cat, dog, mouse, feathers, cockroach  - Avoidance measures discussed. - Use nasal saline rinses before nose sprays such as with Neilmed Sinus Rinse.   Use distilled water.   - If symptoms worsen, use Nasacort 2 sprays each nostril daily. Aim upward and outward. - Use Azelastine 1-2 sprays each nostril twice daily as needed. Aim upward and outward. - Use Xyzal 5mg  daily as needed runny nose, sneezing, itchy watery eyes.  - For eyes, use Olopatadine or Ketotifen 1 eye drop daily as needed for itchy, watery eyes.  Available over the counter, if not covered by insurance.  - Of note, she is followed by Oncology for large cell lymphoma.      Return in about 1 year (around 06/09/2024).  Alesia Morin, MD [facility]

## 2024-08-02 ENCOUNTER — Telehealth: Payer: Self-pay | Admitting: Internal Medicine

## 2024-08-02 NOTE — Telephone Encounter (Signed)
 PT called about Xyxal Rx, saying that [facility] had sent over some form for a refill - I checked Media tab and both fax folders ([place] + [place]) and advised did not see, but would still send a message back for Tobie to review, she thanked

## 2024-08-02 NOTE — Telephone Encounter (Signed)
 I called the patient and informed in order to continue to get refills an appointment is needed as it has been over a year since she was seen. Also informed the patient she can buy medication otc or get pcp to take over prescription if she did not want to schedule. Appointment has been made.

## 2024-08-11 ENCOUNTER — Ambulatory Visit: Admitting: Internal Medicine

## 2024-11-04 ENCOUNTER — Other Ambulatory Visit: Payer: Self-pay | Admitting: Gastroenterology

## 2024-11-04 DIAGNOSIS — Z1589 Genetic susceptibility to other disease: Secondary | ICD-10-CM

## 2024-12-01 ENCOUNTER — Other Ambulatory Visit

## 2024-12-01 ENCOUNTER — Ambulatory Visit
Admission: RE | Admit: 2024-12-01 | Discharge: 2024-12-01 | Disposition: A | Discharge status: Home / Self Care | Source: Ambulatory Visit | Attending: Gastroenterology | Admitting: Gastroenterology

## 2024-12-01 DIAGNOSIS — Z1501 Genetic susceptibility to malignant neoplasm of breast: Secondary | ICD-10-CM

## 2024-12-01 MED ORDER — GADOPICLENOL 0.5 MMOL/ML IV SOLN
9.0000 mL | Freq: Once | INTRAVENOUS | Status: AC | PRN
  Administered 2024-12-01: 9 mL via INTRAVENOUS
